# Patient Record
Sex: Male | Born: 1990 | Race: White | Hispanic: No | Smoking: Current every day smoker
Health system: Southern US, Community
[De-identification: ages and names within clinical notes are randomized; demographics above are authoritative.]

## PROBLEM LIST (undated history)

## (undated) DIAGNOSIS — M545 Low back pain, unspecified: Secondary | ICD-10-CM

## (undated) DIAGNOSIS — F419 Anxiety disorder, unspecified: Secondary | ICD-10-CM

## (undated) HISTORY — PX: OTHER SURGICAL HISTORY: SHX169

---
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
## 2009-03-29 ENCOUNTER — Emergency Department (HOSPITAL_COMMUNITY): Admission: EM | Admit: 2009-03-29 | Discharge: 2009-03-29 | Payer: Self-pay | Admitting: Emergency Medicine

## 2010-01-27 ENCOUNTER — Emergency Department (HOSPITAL_COMMUNITY): Admission: EM | Admit: 2010-01-27 | Discharge: 2010-01-27 | Payer: Self-pay | Admitting: Emergency Medicine

## 2010-07-23 NOTE — Op Note (Signed)
Geoffrey [name], Geoffrey [name]             ACCOUNT NO.:  [id number]   MEDICAL RECORD NO.:  [id number]          PATIENT TYPE:  OIB   LOCATION:  NA                           FACILITY:  [facility]   PHYSICIAN:  Duke Salvia, MD, FACCDATE OF BIRTH:  August 19, 1990   DATE OF PROCEDURE:  07/21/2006  DATE OF DISCHARGE:                               OPERATIVE REPORT   PREOPERATIVE DIAGNOSIS:  Wide complex tachycardia.   POSTOPERATIVE DIAGNOSIS:  Wide complex tachycardia.   PROCEDURE:  Treadmill testing.   The patient was submitted to a standard Bruce protocol, supported in  stage 1.   The patient began to walk and began having runs of nonsustained  polymorphic wide complex rhythm that was initially seen to be  ventricular in origin.  The patient's prodrome for his syncope was in  part suggested by the accompanying symptoms.  The test was terminated.   On subsequent review, each of the wide complex flurries is initiated by  a supraventricular beat suggesting either aberration or PAC induced  ventricular rhythm.  I could not find any clear correlation between the  accompanying intervals and the subsequent rhythm.      Duke Salvia, MD, [facility]  Electronically Signed     SCK/MEDQ  D:  07/21/2006  T:  07/21/2006  Job:  [id number]   cc:   Delaney Meigs, M.D.

## 2010-07-23 NOTE — Op Note (Signed)
NAME:  Geoffrey [name], Geoffrey [name] NO.:  [id number]   MEDICAL RECORD NO.:  [id number]          PATIENT TYPE:  INP   LOCATION:  6150                         FACILITY:  [facility]   PHYSICIAN:  Duke Salvia, MD, FACCDATE OF BIRTH:  [birth year]   DATE OF PROCEDURE:  07/21/2006  DATE OF DISCHARGE:                               OPERATIVE REPORT   Mr. Feggins is a 20 year old gentleman with recurrent syncope  associated with seizures.  He has documented wide complex rhythm on an  event record with inducible wide complex with treadmill testing which  was initially thought to be ventricular in origin but may subsequently  be supraventricular in origin.   The patient was submitted for drug testing to try to identify  potentially life-threatening ventricular arrhythmias giving rise to the  patient's symptom complex given the patient's wide complex tachycardia.   Initially the patient was submitted to progressive epinephrine infusion  starting at 0.05 mcg/kg/minute and progressing every 5 minutes over 0.1,  0.2, 0.3 to a peak of 0.4 mcg/kg/minute.  This was then terminated.   The QT interval with the above increased modestly so the QTC maximum was  520 milliseconds.  This may have represented partly a U wave.   The patient was then submitted to isoproterenol infusion at 1 and 2  mcg/minute.  This was based on observations regarding CPVT.  In some  patients, isoproterenol was associated with induction of ventricular  arrhythmias.   The patient was then subjected to an infusion of 1 gram of procainamide.  No evidence of _______ was noted in the anterior precordium.  The  patient's procedure was terminated and the patient was returned to his  room.      Duke Salvia, MD, [facility]  Electronically Signed     SCK/MEDQ  D:  07/21/2006  T:  07/21/2006  Job:  [id number]

## 2010-07-23 NOTE — Assessment & Plan Note (Signed)
[facility]                                 ON-CALL NOTE   NAME:[name], [name]                      MRN:          [id number]  DATE:08/10/2006                            DOB:          10-11-1990    He is a patient of Dr. Viviann Spare Klein's.   Geoffrey [name], the mother of Geoffrey [name], called in this  evening because Geoffrey [name] saw Dr. Graciela Husbands on the 29th and his Toprol dose was  increased from 50 daily to 50 b.i.d.  Geoffrey [name] reported to his  mother this evening that he was feeling a little bit lightheaded when he  stood up and perhaps a little bit fatigued.  He did take his Toprol  b.i.d., as prescribed, yesterday and then also took his Toprol this  morning and they were getting ready to give it to him this evening.  They do not have a way of checking his blood pressure, do not know how  to take a pulse.  I recommended holding the Toprol this evening and re-  evaluating things in the morning.  As he had tolerated 50 daily in the  past, I suggested that if he is feeling well in the morning to go ahead  and give the 50 tomorrow morning and then if he continues to feel well  throughout the day to give 25 mg nightly rather than 50 mg nightly.  Ms.  [name] verbalized understanding and would call us back if they had any  problems tomorrow.     Nicolasa Ducking, ANP  Electronically Signed    CB/MedQ  DD: 08/10/2006  DT: 08/11/2006  Job #: [id number]

## 2010-07-23 NOTE — Assessment & Plan Note (Signed)
[facility]                         ELECTROPHYSIOLOGY OFFICE NOTE   NAME:[name], [name]                    MRN:          [id number]  DATE:08/06/2006                            DOB:          1990-03-21    Geoffrey [name] comes in with his mom.  He was hospitalized because of  recurrent syncope and polymorphic wide complex rhythms.  This was  initially thought to be ventricular in origin, but on closer inspect of  his treadmill, we saw antecedent P waves.  He went to the  electrophysiological laboratory where catheters demonstrated that there  was in fact significant aberration with frequent PAC's.  Each of the  beats were associated with antecedent Hiss bundle deflection consistent  with an atrial arrhythmia.  He was started on Toprol.  He continues to  have some palpitations.   He and his mom are concerned about the possibility that a seizure  disorder still underlies.  I reiterated my hypothesis that these were  syncopal seizures related to a neurally-mediated reflex in the context  of an atrial arrhythmia and evidence of postural orthostatic tachycardia  (i.e. a dysautonomia).  They would like to be sure, so we will plan to  arrange an EEG and an MRI.   PHYSICAL EXAMINATION:  VITAL SIGNS:  On examination today, his blood  pressure was 102/54, pulse was 70.  LUNGS:  Clear.  HEART:  Heart sounds were regular.   IMPRESSION:  1. 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  2. Atrial tachycardia associated with wide complex polymorphic beats      related to aberration.  3. Evidence of postural orthostatic tachycardia syndrome.   PLAN:  Will continue to:  1. Augment salt intake.  2. Further uptitrate his Toprol to 50 b.i.d.  3. Arrange for an MRI.  4. Arrange for an EEG.   FOLLOWUP:  Will plan to see him again in 3 months time.     Duke Salvia, MD, [facility]  Electronically  Signed    SCK/MedQ  DD: 08/06/2006  DT: 08/06/2006  Job #: [id number]   cc:   Delaney Meigs, M.D.
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
## 2010-07-23 NOTE — Assessment & Plan Note (Signed)
[facility]                          [place] [facility] OFFICE NOTE   NAME:[name], [name]                    MRN:          [id number]  DATE:07/17/2006                            DOB:          May 18, 1990    PRIMARY CARDIOLOGIST:  Dr. Lewayne [name] (new).   PRIMARY ELECTROPHYSIOLOGIST:  Dr. Sherryl [name].   REASON FOR CONSULTATION:  Geoffrey [name] is a 20 year old male, with no  prior cardiac history, who was recently referred to Dr. Sherryl [name] for  evaluation of documented ventricular tachycardia in the setting of a  recent possible syncopal episode. Dr. Graciela [name] felt that his was most  likely related to postural tachycardia and noted that patient presented  with a history of orthostatic intolerance. He also noted episodes of  polymorphic VT (3/6 beats) which all appeared to be initiated by a late-  coupled PVC. There was no clear evidence of QT prolongation, however.   Dr. Graciela [name] recommended further evaluation with a cardiac MRI and this was  negative for RV dysplasia. The patient was scheduled for an  electrophysiology test 2 days ago but the patient declined. He tells me  today that he felt that this was not necessary given that he has felt  much better since increasing dietary sodium and drinking more fluids, as  well as completing cutting out caffeine from his diet.   The patient states that he his symptoms of feeling mildly light-headed  upon standing are much better and he has not had any syncopal episodes.  Regarding the latter, his mother points out to me today that this, in  fact, was only one episode. She states that he does not have a history  of syncope and that, in fact, the episode in question may well have been  a seizure. She states that the people at the scene describe seizure-like  activity. Nevertheless, he has not had any recurrent episodes.   ALLERGIES:  CODEINE.   CURRENT MEDICATIONS:  None.   PAST MEDICAL/SOCIAL/FAMILY  HISTORY:  Please refer to Dr. Clayborne [name]  recent consultation note, for full details.   REVIEW OF SYSTEMS:  The patient denies any prior history of syncope or  any history of seizure. Otherwise as per HPI, remaining systems  negative.   PHYSICAL EXAMINATION:  VITAL SIGNS:  Blood pressure 128/73, pulse 84,  regular, weight 161.  GENERAL:  A 20 year old male, thin, in no distress.  HEENT:  Normocephalic, atraumatic.  NECK:  Palpable bilateral carotid pulses without bruits;  no JVD.  LUNGS:  Clear to auscultation in all fields.  HEART:  Regular rate and rhythm (S1, S2), no significant murmurs.  ABDOMEN:  Benign.  EXTREMITIES:  Palpable distal pulses without edema.  NEUROLOGIC:  No focal deficit.   A 2-D echocardiogram from March 18, read by Dr. Andee [name], reveals normal  left ventricular function (EF 55-60%) with no significant valvular  lesions. There was a restrictive membranous VSD with a velocity of 5  meters per second. A repeat 2-D echo in 1 year was recommended.   IMPRESSION:  1. Status post syncopal episode.      a.  Most likely secondary to postural tachycardia.  2. Orthostatic intolerance.      a.     Much improved with increased dietary sodium intake.  3. Polymorphic ventricular tachycardia.      a.     No evidence of right ventricular dysplasia by recent cardiac       MRI.  4. Restrictive membranous VSD.  5. Tobacco.   PLAN:  Following extensive discussion by Dr. Andee [name] with the patient and  his mother, the patient has agreed to proceed with an electrophysiology  study, as recommended by Dr. Sherryl [name]. We will therefore reschedule  this procedure for him and defer further recommendations to Dr. Sherryl [name].      Geoffrey Serpe, PA-C       Geoffrey Codding, MD,FACC    GS/MedQ  DD: 07/17/2006  DT: 07/17/2006  Job #: [id number]
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
## 2010-07-26 NOTE — Discharge Summary (Signed)
Geoffrey [name], [name] NO.:  [id number]   MEDICAL RECORD NO.:  [id number]          PATIENT TYPE:  INP   LOCATION:  3709                         FACILITY:  [facility]   PHYSICIAN:  Doylene Canning. Ladona Ridgel, MD    DATE OF BIRTH:  Jan 01, 1991   DATE OF ADMISSION:  07/20/2006  DATE OF DISCHARGE:  07/24/2006                               DISCHARGE SUMMARY   He has allergies DTP, Phenergan and CODEINE which gives nausea.   FINAL DIAGNOSIS:  Syncope with convulsive activity, probably neurally  mediated.   SECONDARY DIAGNOSES:  1. Discharging day one status post electrophysiology study:  No      inducible ventricular tachycardia.  Finding of nonsustained      supraventricular tachycardia, atrio-ventricular node reentry      tachycardia versus atrio-ventricular node tachycardia with variable      degrees of aberration (no mapping done).  2. No long QT syndrome.  3. Exercise treadmill study May 13th shows recurrent runs of wide      complex tachycardia which reproduced his symptoms.  4. Drug protocol was initiated on May 13th in the electrophysiology      lab to scout for primary electrical disease:  1) Epinephrine; 2)      Isuprel; 3) Procainamide.  This was a negative study in all cases      but analysis of his wide complex tachycardia shows a deflection in      the T-wave which indicates probable supraventricular origin.      Positive findings on the electrophysiology study done May 15th.      Nonsustained supraventricular tachycardia was observed either atrio-      ventricular node reentry tachycardia or atrio-ventricular reentry      tachycardia.  5. The patient has history of multiple broken bones.   BRIEF HISTORY:  Geoffrey [name] is a 20 year old male with known documented  wide complex tachycardia on a 21-day Holter monitor.  It was placed  after a syncopal episode about 1.5 months ago.  He is scheduled for  electrophysiology study on May 14th but presents early to the  emergency  room on May 13th, brought in by emergency medical services after a  syncopal episode.   He was standing in the kitchen, felt a flutter in his chest and felt his  stomach drop like a roller-coaster.  He sat down on the floor and the  next thing he remembers was waking to hear his mother say call 9-1-1.   The mother says that she had left the room for approximately 5 minutes  and returned to find him on his back, arched, arms drawn up, face and  eyes very red with profuse sweating and agonal breathing.  His eyes were  open and nystagmus was observed.  He was unresponsive and incontinent;  however, within 2-3 minutes, he slowly began to stop twitching and  regained color.  However, had the feeling that someone was pressing  against his chest and was short of breath.  He was weak but coherent,  was quickly able to ambulate, change clothes prior to getting into the  ambulance.  He had the same situation about 1-1/2 months' ago and had partial workup  by Dr. Graciela Husbands and Dr. Andee Lineman for polymorphic ventricular tachycardia.  The patient will be admitted and submitted for electrophysiology consult  and study.   HOSPITAL COURSE:  The patient was admitted on May 13th with a syncopal  episode.  He was due to have a hospital admission for various tests  related to a prior syncopal episode about 1-1/2 months' ago and so this  was a timely admission.  On May 13th, he had exercise treadmill study  which showed recurrent runs of wide complex tachycardia and he was able  to reproduce symptoms when in this wide complex tachycardia.  He also  then underwent drug protocol and electrophysiology labs to check for a  primary electrical disease.  Administration of epinephrine, Isuprel and  Procainamide failed to show any indication of this but an analysis of  the wide complex tachycardia showed a deflection of the T-wave which  pointed to a possible supraventricular origin to his tachyarrhythmia.   He underwent electrophysiology study on May 15th.  The study showed  nonsustained supraventricular tachycardia with aberrant conduction  either AVNRT versus AVRT.  No mapping was done.  The patient is  discharging post study day #1 on May 16th with Toprol XL 50 mg and to  follow-up with Dr. Graciela Husbands in the office on Thursday the 29th at 12:30.  He is asked to control or eliminate caffeine and to boost his sodium  intake.      Maple Mirza, PA      Doylene Canning. Ladona Ridgel, MD  Electronically Signed    GM/MEDQ  D:  09/10/2006  T:  09/11/2006  Job:  [id number]   cc:   Duke Salvia, MD, [facility]  Marolyn Hammock. Thad Ranger, M.D.  Delaney Meigs, M.D.

## 2010-12-02 ENCOUNTER — Emergency Department (HOSPITAL_COMMUNITY)
Admission: EM | Admit: 2010-12-02 | Discharge: 2010-12-02 | Discharge status: Against Medical Advice | Payer: Medicaid Other | Attending: Emergency Medicine | Admitting: Emergency Medicine

## 2010-12-02 ENCOUNTER — Encounter: Payer: Self-pay | Admitting: *Deleted

## 2010-12-02 DIAGNOSIS — Z5320 Procedure and treatment not carried out because of patient's decision for unspecified reasons: Secondary | ICD-10-CM | POA: Insufficient documentation

## 2010-12-02 DIAGNOSIS — R042 Hemoptysis: Secondary | ICD-10-CM | POA: Insufficient documentation

## 2010-12-02 NOTE — ED Notes (Signed)
Pt c/o vomiting x 2 this am. Pt also c/o coughing up dark red blood x 2 days. States that it is now pink.

## 2010-12-04 LAB — URINALYSIS, ROUTINE W REFLEX MICROSCOPIC
Bilirubin Urine: NEGATIVE
Glucose, UA: NEGATIVE
Hgb urine dipstick: NEGATIVE
Ketones, ur: NEGATIVE
Nitrite: NEGATIVE
Protein, ur: NEGATIVE
Specific Gravity, Urine: <1.005 — ABNORMAL LOW
Urobilinogen, UA: 0.2
pH: 5.5

## 2011-02-18 ENCOUNTER — Ambulatory Visit: Payer: Medicaid Other | Admitting: Physical Medicine & Rehabilitation

## 2011-11-17 ENCOUNTER — Emergency Department (HOSPITAL_COMMUNITY)
Admission: EM | Admit: 2011-11-17 | Discharge: 2011-11-17 | Disposition: A | Discharge status: Home / Self Care | Payer: Medicaid Other | Attending: Emergency Medicine | Admitting: Emergency Medicine

## 2011-11-17 ENCOUNTER — Encounter (HOSPITAL_COMMUNITY): Payer: Self-pay

## 2011-11-17 DIAGNOSIS — F1010 Alcohol abuse, uncomplicated: Secondary | ICD-10-CM | POA: Insufficient documentation

## 2011-11-17 DIAGNOSIS — S71119A Laceration without foreign body, unspecified thigh, initial encounter: Secondary | ICD-10-CM

## 2011-11-17 DIAGNOSIS — F17200 Nicotine dependence, unspecified, uncomplicated: Secondary | ICD-10-CM | POA: Insufficient documentation

## 2011-11-17 DIAGNOSIS — S71109A Unspecified open wound, unspecified thigh, initial encounter: Secondary | ICD-10-CM | POA: Insufficient documentation

## 2011-11-17 DIAGNOSIS — F411 Generalized anxiety disorder: Secondary | ICD-10-CM | POA: Insufficient documentation

## 2011-11-17 DIAGNOSIS — X789XXA Intentional self-harm by unspecified sharp object, initial encounter: Secondary | ICD-10-CM | POA: Insufficient documentation

## 2011-11-17 DIAGNOSIS — S71009A Unspecified open wound, unspecified hip, initial encounter: Secondary | ICD-10-CM | POA: Insufficient documentation

## 2011-11-17 HISTORY — DX: Low back pain: M54.5

## 2011-11-17 HISTORY — DX: Anxiety disorder, unspecified: F41.9

## 2011-11-17 HISTORY — DX: Low back pain, unspecified: M54.50

## 2011-11-17 MED ORDER — OXYCODONE-ACETAMINOPHEN 5-325 MG PO TABS
1.0000 | ORAL_TABLET | Freq: Once | ORAL | Status: DC
  Filled 2011-11-17: qty 1

## 2011-11-17 MED ORDER — TETANUS-DIPHTH-ACELL PERTUSSIS 5-2.5-18.5 LF-MCG/0.5 IM SUSP
0.5000 mL | Freq: Once | INTRAMUSCULAR | Status: AC
  Administered 2011-11-17: 0.5 mL via INTRAMUSCULAR

## 2011-11-17 MED ORDER — BACITRACIN ZINC 500 UNIT/GM EX OINT
TOPICAL_OINTMENT | Freq: Once | CUTANEOUS | Status: AC
  Administered 2011-11-17: 1 via TOPICAL
  Filled 2011-11-17: qty 15

## 2011-11-17 MED ORDER — HYDROCODONE-ACETAMINOPHEN 5-325 MG PO TABS
2.0000 | ORAL_TABLET | Freq: Once | ORAL | Status: AC
  Administered 2011-11-17: 2 via ORAL
  Filled 2011-11-17: qty 2

## 2011-11-17 MED ORDER — OXYCODONE-ACETAMINOPHEN 5-325 MG PO TABS
1.0000 | ORAL_TABLET | Freq: Once | ORAL | Status: AC
  Administered 2011-11-17: 1 via ORAL
  Filled 2011-11-17: qty 1

## 2011-11-17 MED ORDER — LIDOCAINE-EPINEPHRINE-TETRACAINE (LET) SOLUTION
9.0000 mL | Freq: Once | NASAL | Status: AC
  Administered 2011-11-17: 9 mL via TOPICAL
  Filled 2011-11-17: qty 9

## 2011-11-17 MED ORDER — ONDANSETRON 4 MG PO TBDP
8.0000 mg | ORAL_TABLET | Freq: Once | ORAL | Status: AC
  Administered 2011-11-17: 8 mg via ORAL
  Filled 2011-11-17: qty 2

## 2011-11-17 MED FILL — Bacitracin Oint 500 Unit/GM: CUTANEOUS | Qty: 1 | Status: AC

## 2011-11-17 NOTE — ED Provider Notes (Signed)
Medical screening examination/treatment/procedure(s) were conducted as a shared visit with non-physician practitioner(s) and myself.  I personally evaluated the patient during the encounter   Geoffrey [name]. Geoffrey Pontarelli, MD 11/17/11 1610
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
## 2011-11-17 NOTE — ED Notes (Signed)
The patient is AOx4 and comfortable with his discharge instructions.  The patient is arranging for a ride home.
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
## 2011-11-17 NOTE — ED Notes (Signed)
The patient states that he had nine beers this evening.

## 2011-11-17 NOTE — Discharge Instructions (Signed)

## 2011-11-17 NOTE — ED Provider Notes (Signed)
LACERATION REPAIR Performed by: Dorthula Matas Authorized by: Dorthula Matas Consent: Verbal consent obtained. Risks and benefits: risks, benefits and alternatives were discussed Consent given by: patient Patient identity confirmed: provided demographic data Prepped and Draped in normal sterile fashion Wound explored  Laceration Location: right thigh  Laceration Length: 7 cm  No Foreign Bodies seen or palpated  Anesthesia: local infiltration  Local anesthetic: lidocaine 2% with epinephrine  Anesthetic total: 6 ml  Irrigation method: syringe Amount of cleaning: standard  Skin closure: staples  Number of sutures: 8  Technique: staples  Patient tolerance: Patient tolerated the procedure well with no immediate complications.    See Dr. Aubery Lapping note for HPI  Dorthula Matas, [place] 11/17/11 [id number]

## 2014-09-12 ENCOUNTER — Inpatient Hospital Stay (HOSPITAL_COMMUNITY)
Admission: RE | Admit: 2014-09-12 | Discharge: 2014-09-17 | DRG: 897 | Disposition: A | Discharge status: Home / Self Care | Payer: No Typology Code available for payment source | Attending: Psychiatry | Admitting: Psychiatry

## 2014-09-12 ENCOUNTER — Encounter (HOSPITAL_COMMUNITY): Payer: Self-pay | Admitting: *Deleted

## 2014-09-12 DIAGNOSIS — G4700 Insomnia, unspecified: Secondary | ICD-10-CM | POA: Diagnosis present

## 2014-09-12 DIAGNOSIS — F1124 Opioid dependence with opioid-induced mood disorder: Secondary | ICD-10-CM | POA: Diagnosis present

## 2014-09-12 DIAGNOSIS — Z820 Family history of epilepsy and other diseases of the nervous system: Secondary | ICD-10-CM

## 2014-09-12 DIAGNOSIS — Z630 Problems in relationship with spouse or partner: Secondary | ICD-10-CM | POA: Diagnosis not present

## 2014-09-12 DIAGNOSIS — M545 Low back pain: Secondary | ICD-10-CM | POA: Diagnosis present

## 2014-09-12 DIAGNOSIS — R45851 Suicidal ideations: Secondary | ICD-10-CM | POA: Diagnosis present

## 2014-09-12 DIAGNOSIS — F1123 Opioid dependence with withdrawal: Secondary | ICD-10-CM | POA: Diagnosis not present

## 2014-09-12 DIAGNOSIS — F17210 Nicotine dependence, cigarettes, uncomplicated: Secondary | ICD-10-CM | POA: Diagnosis present

## 2014-09-12 DIAGNOSIS — F332 Major depressive disorder, recurrent severe without psychotic features: Secondary | ICD-10-CM | POA: Diagnosis present

## 2014-09-12 DIAGNOSIS — I959 Hypotension, unspecified: Secondary | ICD-10-CM | POA: Diagnosis not present

## 2014-09-12 LAB — URINALYSIS, ROUTINE W REFLEX MICROSCOPIC
Bilirubin Urine: NEGATIVE
Glucose, UA: NEGATIVE mg/dL
Hgb urine dipstick: NEGATIVE
Ketones, ur: NEGATIVE mg/dL
Nitrite: NEGATIVE
PH: 7.5 (ref 5.0–8.0)
Protein, ur: NEGATIVE mg/dL
Specific Gravity, Urine: 1.02 (ref 1.005–1.030)
UROBILINOGEN UA: 1 mg/dL (ref 0.0–1.0)

## 2014-09-12 LAB — URINE MICROSCOPIC-ADD ON

## 2014-09-12 LAB — HEPATIC FUNCTION PANEL
ALT: 12 U/L — ABNORMAL LOW (ref 17–63)
AST: 16 U/L (ref 15–41)
Albumin: 4.5 g/dL (ref 3.5–5.0)
Alkaline Phosphatase: 71 U/L (ref 38–126)
BILIRUBIN TOTAL: 0.4 mg/dL (ref 0.3–1.2)
Bilirubin, Direct: <0.1 mg/dL — ABNORMAL LOW (ref 0.1–0.5)
Total Protein: 7.4 g/dL (ref 6.5–8.1)

## 2014-09-12 LAB — COMPREHENSIVE METABOLIC PANEL
ALBUMIN: 4.2 g/dL (ref 3.5–5.0)
ALK PHOS: 67 U/L (ref 38–126)
ALT: 12 U/L — ABNORMAL LOW (ref 17–63)
AST: 17 U/L (ref 15–41)
Anion gap: 6 (ref 5–15)
BUN: 17 mg/dL (ref 6–20)
CO2: 30 mmol/L (ref 22–32)
Calcium: 9.3 mg/dL (ref 8.9–10.3)
Chloride: 102 mmol/L (ref 101–111)
Creatinine, Ser: 0.94 mg/dL (ref 0.61–1.24)
GFR calc Af Amer: >60 mL/min (ref >60)
GFR calc non Af Amer: >60 mL/min (ref >60)
Glucose, Bld: 95 mg/dL (ref 65–99)
POTASSIUM: 4.4 mmol/L (ref 3.5–5.1)
Sodium: 138 mmol/L (ref 135–145)
TOTAL PROTEIN: 7.1 g/dL (ref 6.5–8.1)
Total Bilirubin: 0.2 mg/dL — ABNORMAL LOW (ref 0.3–1.2)

## 2014-09-12 LAB — CBC
HCT: 40.7 % (ref 39.0–52.0)
Hemoglobin: 14.4 g/dL (ref 13.0–17.0)
MCH: 32.2 pg (ref 26.0–34.0)
MCHC: 35.4 g/dL (ref 30.0–36.0)
MCV: 91.1 fL (ref 78.0–100.0)
PLATELETS: 179 10*3/uL (ref 150–400)
RBC: 4.47 MIL/uL (ref 4.22–5.81)
RDW: 13.1 % (ref 11.5–15.5)
WBC: 9.3 10*3/uL (ref 4.0–10.5)

## 2014-09-12 LAB — RAPID URINE DRUG SCREEN, HOSP PERFORMED
Amphetamines: NOT DETECTED
Barbiturates: NOT DETECTED
Benzodiazepines: NOT DETECTED
Cocaine: NOT DETECTED
OPIATES: NOT DETECTED
TETRAHYDROCANNABINOL: NOT DETECTED

## 2014-09-12 LAB — ETHANOL: Alcohol, Ethyl (B): <5 mg/dL (ref <5)

## 2014-09-12 LAB — TSH: TSH: 0.162 u[IU]/mL — AB (ref 0.350–4.500)

## 2014-09-12 MED ORDER — NICOTINE 21 MG/24HR TD PT24
21.0000 mg | MEDICATED_PATCH | Freq: Every day | TRANSDERMAL | Status: DC
  Administered 2014-09-13 – 2014-09-15 (×3): 21 mg via TRANSDERMAL
  Filled 2014-09-12 (×5): qty 1

## 2014-09-12 MED ORDER — NICOTINE 21 MG/24HR TD PT24
21.0000 mg | MEDICATED_PATCH | Freq: Once | TRANSDERMAL | Status: AC
  Administered 2014-09-12: 21 mg via TRANSDERMAL
  Filled 2014-09-12: qty 1

## 2014-09-12 MED ORDER — ENSURE ENLIVE PO LIQD
237.0000 mL | Freq: Two times a day (BID) | ORAL | Status: DC
  Administered 2014-09-12 – 2014-09-17 (×10): 237 mL via ORAL

## 2014-09-12 MED ORDER — PNEUMOCOCCAL VAC POLYVALENT 25 MCG/0.5ML IJ INJ
0.5000 mL | INJECTION | INTRAMUSCULAR | Status: AC
  Administered 2014-09-13: 0.5 mL via INTRAMUSCULAR

## 2014-09-12 MED ORDER — ALUM & MAG HYDROXIDE-SIMETH 200-200-20 MG/5ML PO SUSP
30.0000 mL | ORAL | Status: DC | PRN
  Administered 2014-09-14 – 2014-09-16 (×5): 30 mL via ORAL
  Filled 2014-09-12 (×5): qty 30

## 2014-09-12 MED ORDER — TRAZODONE HCL 50 MG PO TABS
50.0000 mg | ORAL_TABLET | Freq: Every evening | ORAL | Status: DC | PRN
  Administered 2014-09-12: 50 mg via ORAL
  Filled 2014-09-12: qty 1

## 2014-09-12 MED ORDER — ACETAMINOPHEN 325 MG PO TABS
650.0000 mg | ORAL_TABLET | Freq: Four times a day (QID) | ORAL | Status: DC | PRN
  Administered 2014-09-12 – 2014-09-16 (×4): 650 mg via ORAL
  Filled 2014-09-12 (×4): qty 2

## 2014-09-12 MED ORDER — MAGNESIUM HYDROXIDE 400 MG/5ML PO SUSP
30.0000 mL | Freq: Every day | ORAL | Status: DC | PRN
  Administered 2014-09-16: 30 mL via ORAL
  Filled 2014-09-12: qty 30

## 2014-09-12 MED ORDER — HYDROXYZINE HCL 25 MG PO TABS
25.0000 mg | ORAL_TABLET | Freq: Four times a day (QID) | ORAL | Status: DC | PRN
  Administered 2014-09-12 – 2014-09-14 (×6): 25 mg via ORAL
  Filled 2014-09-12 (×7): qty 1
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
## 2014-09-12 NOTE — Progress Notes (Deleted)
[facility] MD Progress Note  09/12/2014 6:59 PM Geoffrey [name]  MRN:  [id number] Subjective:  Geoffrey [name] states he is working towards getting his life back together. He continues to be mindful of the negative thoughts and is working towards switching them. He is going to get out of his apartment in [place] and move to a trailer in [place] with his wife. 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 States he has experienced some events here that could have elicited anger spells and they have not. He is hopeful that the medication will continue to work. He states he is committed not to use cough medications again.  Principal Problem: <principal problem not specified> Diagnosis:   Patient Active Problem List   Diagnosis Date Noted  . MDD (major depressive disorder), recurrent episode, severe [F33.2] 09/12/2014   Total Time spent with patient: 30 minutes   Past Medical History:  Past Medical History  Diagnosis Date  . Back pain, lumbosacral   . Anxiety     Past Surgical History  Procedure Laterality Date  . Arm surgery     Family History:  Family History  Problem Relation Age of Onset  . Multiple sclerosis Mother    Social History:  History  Alcohol Use  . 1.2 oz/week  . 2 Cans of beer per week     History  Drug Use  . Yes  . Special: Heroin, "Crack" cocaine    History   Social History  . Marital Status: Single    Spouse Name: N/A  . Number of Children: N/A  . Years of Education: N/A   Social History Main Topics  . Smoking status: Current Every Day Smoker -- 1.00 packs/day for 7 years    Types: Cigarettes  . Smokeless tobacco: Never Used  . Alcohol Use: 1.2 oz/week    2 Cans of beer per week  . Drug Use: Yes    Special: Heroin, "Crack" cocaine  . Sexual Activity: Yes   Other Topics Concern  . None   Social History Narrative   Additional History:    Sleep: Fair  Appetite:  Fair   Assessment:   Musculoskeletal: Strength & Muscle Tone: within normal  limits Gait & Station: normal Patient leans: normal   Psychiatric Specialty Exam: Physical Exam  Review of Systems  Constitutional: Negative.   Eyes: Negative.   Respiratory: Negative.   Cardiovascular: Negative.   Gastrointestinal: Negative.   Genitourinary: Negative.   Musculoskeletal: Negative.   Skin: Negative.   Neurological: Positive for headaches.  Endo/Heme/Allergies: Negative.   Psychiatric/Behavioral: Positive for substance abuse. The patient is nervous/anxious.     Blood pressure 131/78, pulse 68, temperature 98 F (36.7 C), temperature source Oral, resp. rate 18, height  (1.905 m), weight 78.472 kg (173 lb).Body mass index is 21.62 kg/(m^2).  General Appearance: Fairly Groomed  Patent attorney::  Fair  Speech:  Clear and Coherent  Volume:  fluctuates  Mood:  Anxious and worried  Affect:  anxious worried  Thought Process:  Coherent and Goal Directed  Orientation:  Full (Time, Place, and Person)  Thought Content:  symptoms events worries concerns  Suicidal Thoughts:  No  Homicidal Thoughts:  No  Memory:  Immediate;   Fair Recent;   Fair Remote;   Fair  Judgement:  Fair  Insight:  Present  Psychomotor Activity:  Restlessness  Concentration:  Fair  Recall:  [employer] of Knowledge:Fair  Language: Fair  Akathisia:  No  Handed:  Right  AIMS (if indicated):     Assets:  Desire for Improvement Housing Social Support Vocational/Educational  ADL's:  Intact  Cognition: WNL  Sleep:        Current Medications: Current Facility-Administered Medications  Medication Dose Route Frequency Provider Last Rate Last Dose  . acetaminophen (TYLENOL) tablet 650 mg  650 mg Oral Q6H PRN Beau FannyJohn C Withrow, FNP   650 mg at 09/12/14 1814  . alum & mag hydroxide-simeth (MAALOX/MYLANTA) 200-200-20 MG/5ML suspension 30 mL  30 mL Oral Q4H PRN Beau FannyJohn C Withrow, FNP      . feeding supplement (ENSURE ENLIVE) (ENSURE ENLIVE) liquid 237 mL  237 mL Oral BID BM Rockey SituFernando A Cobos, MD   237  mL at 09/12/14 1546  . hydrOXYzine (ATARAX/VISTARIL) tablet 25 mg  25 mg Oral Q6H PRN Beau FannyJohn C Withrow, FNP   25 mg at 09/12/14 1814  . magnesium hydroxide (MILK OF MAGNESIA) suspension 30 mL  30 mL Oral Daily PRN Beau FannyJohn C Withrow, FNP      . [START ON 09/13/2014] nicotine (NICODERM CQ - dosed in mg/24 hours) patch 21 mg  21 mg Transdermal Q0600 Sanjuana KavaAgnes I Nwoko, NP      . nicotine (NICODERM CQ - dosed in mg/24 hours) patch 21 mg  21 mg Transdermal Once Craige CottaFernando A Cobos, MD   21 mg at 09/12/14 1814  . [START ON 09/13/2014] pneumococcal 23 valent vaccine (PNU-IMMUNE) injection 0.5 mL  0.5 mL Intramuscular Tomorrow-1000 Fernando A Cobos, MD      . traZODone (DESYREL) tablet 50 mg  50 mg Oral QHS PRN Beau FannyJohn C Withrow, FNP        Lab Results: No results found for this or any previous visit (from the past 48 hour(s)).  Physical Findings: AIMS: Facial and Oral Movements Muscles of Facial Expression: None, normal Lips and Perioral Area: None, normal Jaw: None, normal Tongue: None, normal,Extremity Movements Upper (arms, wrists, hands, fingers): None, normal Lower (legs, knees, ankles, toes): None, normal, Trunk Movements Neck, shoulders, hips: None, normal, Overall Severity Severity of abnormal movements (highest score from questions above): None, normal Incapacitation due to abnormal movements: None, normal Patient's awareness of abnormal movements (rate only patient's report): No Awareness, Dental Status Current problems with teeth and/or dentures?: No Does patient usually wear dentures?: No  CIWA:  CIWA-Ar Total: 6 COWS:  COWS Total Score: 5  Treatment Plan Summary: Daily contact with patient to assess and evaluate symptoms and progress in treatment and Medication management Supportive approach/coping skills Cough medicine abuse; continue to work a relapse prevention plan Mood instability; continue to work with the Depakote Get a Depakote level Anger; work with anger management  techniques CBT/mindfulness  Medical Decision Making:  Review of Psycho-Social Stressors (1), Review of Medication Regimen & Side Effects (2) and Review of New Medication or Change in Dosage (2)     Cherrill Scrima A 09/12/2014, 6:59 PM

## 2014-09-12 NOTE — Progress Notes (Signed)
ADMISSION NOTE: D: Patient is alert and oriented. Pt's mood and affect is depressed, sad, and blunted. Pt denies SI/HI and AVH. [facility] from "mostly opiates, heroin (pt denies IV use), oxycontin, subutex, and cocaine." Pt denies alcohol abuse, reports he drinks "once a week" and denies binge drinking. Pt reports he is prescribed adderall for ADHD, pt states "thats one of the only things I don't really abuse." Pt reports his wife left him in December and has their two children, he reports "I have opportunities to see them but I don't want them to see me high." Pt reports his father died in 1997, suspected suicide by MVA. Pt reports he does have access to firearms at home and has self harmed once in the past when he cut his leg and needed sutures. Pt reports he is temporarily living with his mother whom is supportive of him, pt reports he is hopeful of "getting back on my feet soon." Pt reports hx of "syncope" last episode "7 years ago." Pt reports his current withdrawal symptoms include nausea, anxiety/nervousness, some sweatiness, moderate headache pain 7/10. 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 Pt reports recent weight loss of "about 20lbs" d/t drug use, pt states "I spend all my money on drugs and not food." A: Pt oriented to unit. Pt given nutrition. Pt given hygiene products. Urine specimen cup given to pt, awaiting return with specimen. Alcohol Use disorders exam given to pt, pt asked to return to RN once he completes questionnaire. Active listening by RN. Encouragement/Support provided to pt. NP Nicole KindredAgnes made aware of pt's arrival to unit and withdrawal symptoms. 15 minute checks initiated per protocol for patient safety.  R: Pt cooperative to admission process. Pt remains safe.

## 2014-09-12 NOTE — Tx Team (Signed)
Initial Interdisciplinary Treatment Plan   PATIENT STRESSORS: Marital or family conflict Substance abuse   PATIENT STRENGTHS: Communication skills General fund of knowledge Supportive family/friends   PROBLEM LIST: Problem List/Patient Goals Date to be addressed Date deferred Reason deferred Estimated date of resolution  Safety 09/12/2014     Withdrawal/Detox/Substance abuse 09/12/2014     Depression 09/12/2014     Anxiety 09/12/2014     "To not want any drugs." 09/12/2014     "Comfortable and confident about leaving." 09/12/2014                        DISCHARGE CRITERIA:  Improved stabilization in mood, thinking, and/or behavior Verbal commitment to aftercare and medication compliance Withdrawal symptoms are absent or subacute and managed without 24-hour nursing intervention  PRELIMINARY DISCHARGE PLAN: Attend 12-step recovery group Outpatient therapy Return to previous living arrangement  PATIENT/FAMIILY INVOLVEMENT: This treatment plan has been presented to and reviewed with the patient, Geoffrey [name].  The patient and family have been given the opportunity to ask questions and make suggestions.  Geoffrey [name], Geoffrey [name] 09/12/2014, 3:21 PM

## 2014-09-12 NOTE — Progress Notes (Signed)
Adult Psychoeducational Group Note  Date:  09/12/2014 Time:  11:36 PM  Group Topic/Focus:    Participation Level:    Participation Quality:    Affect:    Cognitive:    Insight:   Engagement in Group:    Modes of Intervention:    Additional Comments:  Patient did not attend group.  Natasha MeadKiara M Esma Kilts 09/12/2014, 11:36 PM

## 2014-09-12 NOTE — Progress Notes (Signed)
Patient ID: Geoffrey BroachRobert M [name], male   DOB: May 04, 1990, 24 y.o.   MRN: [id number] D: client in bed most of this shift up for medications, complains of stomach problems "from withdrawal" Client notes "they are waiting for the lab work before they give me something" A: Writer provided emotional support, reviewed and administered medications ordered. COW was "0" Staff will monitor q7015min for safety. R: Client is safe on the unit, did not attend group. Writer viewed toxicity, negative result, client unaware, told physician will review and speak with him tomorrow.

## 2014-09-12 NOTE — Progress Notes (Signed)
Urine specimen collected per providers orders and placed in specimen refrigerator, awaiting lab pick-up.

## 2014-09-13 ENCOUNTER — Encounter (HOSPITAL_COMMUNITY): Payer: Self-pay | Admitting: Registered Nurse

## 2014-09-13 DIAGNOSIS — R45851 Suicidal ideations: Secondary | ICD-10-CM

## 2014-09-13 DIAGNOSIS — F1123 Opioid dependence with withdrawal: Secondary | ICD-10-CM

## 2014-09-13 DIAGNOSIS — F1124 Opioid dependence with opioid-induced mood disorder: Secondary | ICD-10-CM

## 2014-09-13 DIAGNOSIS — F332 Major depressive disorder, recurrent severe without psychotic features: Secondary | ICD-10-CM

## 2014-09-13 LAB — LIPID PANEL
Cholesterol: 160 mg/dL (ref 0–200)
HDL: 41 mg/dL (ref >40)
LDL Cholesterol: 92 mg/dL (ref 0–99)
Total CHOL/HDL Ratio: 3.9 RATIO
Triglycerides: 136 mg/dL (ref <150)
VLDL: 27 mg/dL (ref 0–40)

## 2014-09-13 MED ORDER — METHOCARBAMOL 500 MG PO TABS
500.0000 mg | ORAL_TABLET | Freq: Three times a day (TID) | ORAL | Status: DC | PRN
  Administered 2014-09-14: 500 mg via ORAL
  Filled 2014-09-13: qty 1

## 2014-09-13 MED ORDER — ONDANSETRON 4 MG PO TBDP: 4.0000 mg | ORAL_TABLET | Freq: Four times a day (QID) | ORAL | Status: DC | PRN

## 2014-09-13 MED ORDER — CLONIDINE HCL 0.1 MG PO TABS
0.1000 mg | ORAL_TABLET | ORAL | Status: DC
  Filled 2014-09-13: qty 1

## 2014-09-13 MED ORDER — DICYCLOMINE HCL 20 MG PO TABS
20.0000 mg | ORAL_TABLET | Freq: Four times a day (QID) | ORAL | Status: DC | PRN
  Administered 2014-09-14: 20 mg via ORAL
  Filled 2014-09-13: qty 1

## 2014-09-13 MED ORDER — NAPROXEN 500 MG PO TABS
500.0000 mg | ORAL_TABLET | Freq: Two times a day (BID) | ORAL | Status: DC | PRN
  Administered 2014-09-13 – 2014-09-14 (×2): 500 mg via ORAL
  Filled 2014-09-13 (×2): qty 1

## 2014-09-13 MED ORDER — CLONIDINE HCL 0.1 MG PO TABS
0.1000 mg | ORAL_TABLET | Freq: Four times a day (QID) | ORAL | Status: DC
  Administered 2014-09-13 – 2014-09-14 (×2): 0.1 mg via ORAL
  Filled 2014-09-13 (×12): qty 1

## 2014-09-13 MED ORDER — TRAZODONE HCL 100 MG PO TABS
100.0000 mg | ORAL_TABLET | Freq: Every evening | ORAL | Status: DC | PRN
  Administered 2014-09-13: 100 mg via ORAL
  Filled 2014-09-13: qty 1

## 2014-09-13 MED ORDER — LOPERAMIDE HCL 2 MG PO CAPS: 2.0000 mg | ORAL_CAPSULE | ORAL | Status: DC | PRN

## 2014-09-13 MED ORDER — CLONIDINE HCL 0.1 MG PO TABS: 0.1000 mg | ORAL_TABLET | Freq: Every day | ORAL | Status: DC

## 2014-09-13 NOTE — [facility] Group Notes (Signed)
[facility] LCSW Group Therapy 09/13/2014  1:15 PM   Type of Therapy: Group Therapy  Participation Level: Did Not Attend. Patient invited to participate but declined.   Traeson Dusza, MSW, LCSWA Clinical Social Worker [facility] [phone]    

## 2014-09-13 NOTE — H&P (Signed)
Psychiatric Admission Assessment Adult  Patient Identification: Geoffrey [name] MRN:  [id number] Date of Evaluation:  09/13/2014 Chief Complaint:  depression substance use disorder Principal Diagnosis: <principal problem not specified> Diagnosis:   Patient Active Problem List   Diagnosis Date Noted  . MDD (major depressive disorder), recurrent episode, severe [F33.2] 09/12/2014   History of Present Illness:: "I was a walk in. I came in so that I could come off of drugs and detox."  Patient states that he uses multiple drugs Oxycodone, OxyContin, Suboxone, Subutex, Heroin, and Cocaine,  "I been doing heavy for the last two years.  Patient states that he and his wife has separated, states that he two boys that he can see any time he wants but only sees 2-3 time a month because of his drug use and his being high and state that his drug use is also one of the reason that his wife left.  Patient states that he recently quit his job sot that he could get clean.  States that he is living with his mother who is supportive.  States that he has brother who is older and a sister younger.  "I don't speak to my brother is worse off than I am on drugs."  Patient states that he has never been in psychiatric hospital but has had outpatient services and treatment for depression with medication with Prozac, Paxil, and Cymbalta "Which all made my depression worse which all lead to me doing dugs they all mad me feel suicidal and I don't want to do no more medicine for depression." At this time patient continues to endorse depression but denies that he is suicidal.  Patient appears depressed with flat affect.  States that he does worry about finding another job and getting and staying off of drugs and putting his life back together.  When talking about not spending time with his children because of drugs patient  Looked tearful but also restricted.      Elements:  Location:  Polysubstance Abuse. Quality:  Worsening  depression. Severity:  Severe. Duration:  Several days. Associated Signs/Symptoms: Depression Symptoms:  depressed mood, hopelessness, suicidal thoughts without plan, panic attacks, (Hypo) Manic Symptoms:  Irritable Mood, Anxiety Symptoms:  Excessive Worry, Panic Symptoms, Psychotic Symptoms:  Denies PTSD Symptoms: Denies Total Time spent with patient: 1 hour  Past Medical History:  Past Medical History  Diagnosis Date  . Back pain, lumbosacral   . Anxiety     Past Surgical History  Procedure Laterality Date  . Arm surgery     Family History:  Family History  Problem Relation Age of Onset  . Multiple sclerosis Mother    Social History:  History  Alcohol Use  . 1.2 oz/week  . 2 Cans of beer per week     History  Drug Use  . Yes  . Special: Heroin, "Crack" cocaine    History   Social History  . Marital Status: Single    Spouse Name: N/A  . Number of Children: N/A  . Years of Education: N/A   Social History Main Topics  . Smoking status: Current Every Day Smoker -- 1.00 packs/day for 7 years    Types: Cigarettes  . Smokeless tobacco: Never Used  . Alcohol Use: 1.2 oz/week    2 Cans of beer per week  . Drug Use: Yes    Special: Heroin, "Crack" cocaine  . Sexual Activity: Yes   Other Topics Concern  . None   Social History Narrative  Additional Social History:    Pain Medications: See note History of alcohol / drug use?: Yes Name of Substance 1: Opiates 1 - Age of First Use: 20 1 - Amount (size/oz): 120-160 mg Oxycontin  1 - Frequency: Daily 1 - Duration: On going 1 - Last Use / Amount: Yesterday   Musculoskeletal: Strength & Muscle Tone: within normal limits Gait & Station: normal Patient leans: N/A  Psychiatric Specialty Exam: Physical Exam  Vitals reviewed. Constitutional: He is oriented to person, place, and time.  Neck: Normal range of motion.  Respiratory: Effort normal.  Musculoskeletal: Normal range of motion.  Neurological:  He is alert and oriented to person, place, and time.    Review of Systems  Constitutional: Positive for chills and diaphoresis.  Gastrointestinal: Positive for nausea, abdominal pain and constipation.  Musculoskeletal: Positive for neck pain.  Neurological: Positive for dizziness, tingling, tremors, seizures and headaches.       Patient states that he thinks about 8 yrs ago he had 2 seizures about 6 months a part where he would fall in the floor "I would bite my lip, pee my self, shake, feel sleep when finished.  It hasn't happened since.  No I won't doing drugs then."    Psychiatric/Behavioral: Positive for depression and substance abuse. Negative for hallucinations. The patient does not have insomnia.        Patient states when he hasn't had sleep.  "I can remember when driving to work one morning look like something jumped in front of me.  That is when I was up for 4 to 5 day."  Sates on a normal basis he has no hallucinations     Blood pressure 131/74, pulse 73, temperature 98 F (36.7 C), temperature source Oral, resp. rate 16, height $RemoveBe'6\' 3"'[id number]$  (1.905 m), weight 78.472 kg (173 lb).Body mass index is 21.62 kg/(m^2).  General Appearance: Casual  Eye Contact::  Fair  Speech:  Clear and Coherent and Normal Rate  Volume:  Decreased  Mood:  Depressed  Affect:  Depressed and Flat  Thought Process:  Linear  Orientation:  Full (Time, Place, and Person)  Thought Content:  Denies hallucinations, delusions, and paranoia  Suicidal Thoughts:  Yes.  without intent/plan  Homicidal Thoughts:  No  Memory:  Immediate;   Good Recent;   Good Remote;   Good  Judgement:  Fair  Insight:  Lacking  Psychomotor Activity:  Decreased  Concentration:  Fair  Recall:  Good  Fund of Knowledge:Good  Language: Good  Akathisia:  No  Handed:  Right  AIMS (if indicated):     Assets:  Communication Skills Desire for Improvement Housing Physical Health Social Support  ADL's:  Intact  Cognition: WNL  Sleep:       Risk to Self: Suicidal Ideation: Yes-Currently Present Suicidal Intent: No-Not Currently/Within Last 6 Months Is patient at risk for suicide?: Yes Suicidal Plan?: Yes-Currently Present Specify Current Suicidal Plan: overdose Access to Means: Yes Specify Access to Suicidal Means: opiates What has been your use of drugs/alcohol within the last 12 months?: Daily opiate use, occasional cocaine use How many times?: 2 (gestures) Triggers for Past Attempts: Other personal contacts, Family contact Intentional Self Injurious Behavior: Cutting Comment - Self Injurious Behavior: pt has large scar on upper right thigh from self harm Risk to Others: Homicidal Ideation: No Thoughts of Harm to Others: No Current Homicidal Intent: No Current Homicidal Plan: No Access to Homicidal Means: No History of harm to others?: No Assessment of Violence: None  Noted Does patient have access to weapons?: No Criminal Charges Pending?: No Does patient have a court date: No Prior Inpatient Therapy: Prior Inpatient Therapy: No Prior Outpatient Therapy: Prior Outpatient Therapy: Yes Prior Therapy Dates: during childhood-age 4 Reason for Treatment: Father commited suicide Does patient have an ACCT team?: No Does patient have Intensive In-House Services?  : No Does patient have [facility] services? : No Does patient have P4[facility] services?: No  Alcohol Screening: 1. How often do you have a drink containing alcohol?: Monthly or less 2. How many drinks containing alcohol do you have on a typical day when you are drinking?: 1 or 2 3. How often do you have six or more drinks on one occasion?: Less than monthly Preliminary Score: 1 4. How often during the last year have you found that you were not able to stop drinking once you had started?: Monthly 5. How often during the last year have you failed to do what was normally expected from you becasue of drinking?: Never 6. How often during the last year have you needed a  first drink in the morning to get yourself going after a heavy drinking session?: Never 7. How often during the last year have you had a feeling of guilt of remorse after drinking?: Never 8. How often during the last year have you been unable to remember what happened the night before because you had been drinking?: Less than monthly 9. Have you or someone else been injured as a result of your drinking?: No 10. Has a relative or friend or a doctor or another health worker been concerned about your drinking or suggested you cut down?: No Alcohol Use Disorder Identification Test Final Score (AUDIT): 5 Brief Intervention: Yes  Allergies:  No Known Allergies Lab Results:  Results for orders placed or performed during the hospital encounter of 09/12/14 (from the past 48 hour(s))  Urine rapid drug screen (hosp performed)not at [facility]     Status: None   Collection Time: 09/12/14  6:07 PM  Result Value Ref Range   Opiates NONE DETECTED NONE DETECTED   Cocaine NONE DETECTED NONE DETECTED   Benzodiazepines NONE DETECTED NONE DETECTED   Amphetamines NONE DETECTED NONE DETECTED   Tetrahydrocannabinol NONE DETECTED NONE DETECTED   Barbiturates NONE DETECTED NONE DETECTED    Comment:        DRUG SCREEN [place].  IF CONFIRMATION IS NEEDED FOR ANY PURPOSE, NOTIFY LAB WITHIN 5 DAYS.        LOWEST DETECTABLE LIMITS FOR URINE DRUG SCREEN Drug Class       Cutoff (ng/mL) Amphetamine      1000 Barbiturate      200 Benzodiazepine   756 Tricyclics       433 Opiates          300 Cocaine          300 THC              50 Performed at [facility]   Urinalysis, Routine w reflex microscopic (not at [facility])     Status: Abnormal   Collection Time: 09/12/14  6:07 PM  Result Value Ref Range   Color, Urine YELLOW YELLOW   APPearance CLOUDY (A) CLEAR   Specific Gravity, Urine 1.020 1.005 - 1.030   pH 7.5 5.0 - 8.0   Glucose, UA NEGATIVE NEGATIVE mg/dL   Hgb urine dipstick  NEGATIVE NEGATIVE   Bilirubin Urine NEGATIVE NEGATIVE   Ketones, ur NEGATIVE NEGATIVE  mg/dL   Protein, ur NEGATIVE NEGATIVE mg/dL   Urobilinogen, UA 1.0 0.0 - 1.0 mg/dL   Nitrite NEGATIVE NEGATIVE   Leukocytes, UA SMALL (A) NEGATIVE    Comment: Performed at [facility]  Urine microscopic-add on     Status: None   Collection Time: 09/12/14  6:07 PM  Result Value Ref Range   WBC, UA 0-2 <3 WBC/hpf   Bacteria, UA RARE RARE   Urine-Other AMORPHOUS URATES/PHOSPHATES     Comment: Performed at [facility]  CBC     Status: None   Collection Time: 09/12/14  7:22 PM  Result Value Ref Range   WBC 9.3 4.0 - 10.5 K/uL   RBC 4.47 4.22 - 5.81 MIL/uL   Hemoglobin 14.4 13.0 - 17.0 g/dL   HCT 40.7 39.0 - 52.0 %   MCV 91.1 78.0 - 100.0 fL   MCH 32.2 26.0 - 34.0 pg   MCHC 35.4 30.0 - 36.0 g/dL   RDW 13.1 11.5 - 15.5 %   Platelets 179 150 - 400 K/uL    Comment: PLATELET COUNT CONFIRMED BY SMEAR REPEATED TO VERIFY SPECIMEN CHECKED FOR CLOTS Performed at [facility]   Comprehensive metabolic panel     Status: Abnormal   Collection Time: 09/12/14  7:22 PM  Result Value Ref Range   Sodium 138 135 - 145 mmol/L   Potassium 4.4 3.5 - 5.1 mmol/L   Chloride 102 101 - 111 mmol/L   CO2 30 22 - 32 mmol/L   Glucose, Bld 95 65 - 99 mg/dL   BUN 17 6 - 20 mg/dL   Creatinine, Ser 0.94 0.61 - 1.24 mg/dL   Calcium 9.3 8.9 - 10.3 mg/dL   Total Protein 7.1 6.5 - 8.1 g/dL   Albumin 4.2 3.5 - 5.0 g/dL   AST 17 15 - 41 U/L   ALT 12 (L) 17 - 63 U/L   Alkaline Phosphatase 67 38 - 126 U/L   Total Bilirubin 0.2 (L) 0.3 - 1.2 mg/dL   GFR calc non Af Amer >60 >60 mL/min   GFR calc Af Amer >60 >60 mL/min    Comment: (NOTE) The eGFR has been calculated using the CKD EPI equation. This calculation has not been validated in all clinical situations. eGFR's persistently <60 mL/min signify possible Chronic Kidney Disease.    Anion gap 6 5 - 15    Comment:  Performed at [facility]  Ethanol     Status: None   Collection Time: 09/12/14  7:22 PM  Result Value Ref Range   Alcohol, Ethyl (B) <5 <5 mg/dL    Comment:        LOWEST DETECTABLE LIMIT FOR SERUM ALCOHOL IS 5 mg/dL FOR MEDICAL PURPOSES ONLY Performed at [facility]   Lipid panel, fasting     Status: None   Collection Time: 09/12/14  7:22 PM  Result Value Ref Range   Cholesterol 160 0 - 200 mg/dL   Triglycerides 136 <150 mg/dL   HDL 41 >40 mg/dL   Total CHOL/HDL Ratio 3.9 RATIO   VLDL 27 0 - 40 mg/dL   LDL Cholesterol 92 0 - 99 mg/dL    Comment:        Total Cholesterol/HDL:CHD Risk Coronary Heart Disease Risk Table                     Men   Women  1/2 Average Risk   3.4  3.3  Average Risk       5.0   4.4  2 X Average Risk   9.6   7.1  3 X Average Risk  23.4   11.0        Use the calculated Patient Ratio above and the CHD Risk Table to determine the patient's CHD Risk.        ATP III CLASSIFICATION (LDL):  <100     mg/dL   Optimal  100-129  mg/dL   Near or Above                    Optimal  130-159  mg/dL   Borderline  160-189  mg/dL   High  >190     mg/dL   Very High Performed at [facility]   Hepatic function panel     Status: Abnormal   Collection Time: 09/12/14  7:22 PM  Result Value Ref Range   Total Protein 7.4 6.5 - 8.1 g/dL   Albumin 4.5 3.5 - 5.0 g/dL   AST 16 15 - 41 U/L   ALT 12 (L) 17 - 63 U/L   Alkaline Phosphatase 71 38 - 126 U/L   Total Bilirubin 0.4 0.3 - 1.2 mg/dL   Bilirubin, Direct <0.1 (L) 0.1 - 0.5 mg/dL   Indirect Bilirubin NOT CALCULATED 0.3 - 0.9 mg/dL    Comment: Performed at [facility]  TSH     Status: Abnormal   Collection Time: 09/12/14  7:22 PM  Result Value Ref Range   TSH 0.162 (L) 0.350 - 4.500 uIU/mL    Comment: Performed at [facility]   Current Medications: Current Facility-Administered Medications  Medication Dose Route Frequency  Provider Last Rate Last Dose  . acetaminophen (TYLENOL) tablet 650 mg  650 mg Oral Q6H PRN Benjamine Mola, FNP   650 mg at 09/12/14 1814  . alum & mag hydroxide-simeth (MAALOX/MYLANTA) 200-200-20 MG/5ML suspension 30 mL  30 mL Oral Q4H PRN Benjamine Mola, FNP      . feeding supplement (ENSURE ENLIVE) (ENSURE ENLIVE) liquid 237 mL  237 mL Oral BID BM Myer Peer Latoyia Tecson, MD   237 mL at 09/13/14 1039  . hydrOXYzine (ATARAX/VISTARIL) tablet 25 mg  25 mg Oral Q6H PRN Benjamine Mola, FNP   25 mg at 09/13/14 0630  . magnesium hydroxide (MILK OF MAGNESIA) suspension 30 mL  30 mL Oral Daily PRN Benjamine Mola, FNP      . nicotine (NICODERM CQ - dosed in mg/24 hours) patch 21 mg  21 mg Transdermal Q0600 Encarnacion Slates, NP   21 mg at 09/13/14 5188  . nicotine (NICODERM CQ - dosed in mg/24 hours) patch 21 mg  21 mg Transdermal Once Jenne Campus, MD   21 mg at 09/12/14 1814  . traZODone (DESYREL) tablet 50 mg  50 mg Oral QHS PRN Benjamine Mola, FNP   50 mg at 09/12/14 2334   PTA Medications: Prescriptions prior to admission  Medication Sig Dispense Refill Last Dose  . DULoxetine (CYMBALTA) 60 MG capsule Take 60 mg by mouth daily.   11/16/2011 at Unknown  . LORazepam (ATIVAN) 2 MG tablet Take 2 mg by mouth every 6 (six) hours as needed. For anxiety   11/16/2011 at Unknown  . varenicline (CHANTIX PAK) 0.5 MG X 11 & 1 MG X 42 tablet Take by mouth 2 (two) times daily. Take one 0.5 mg tablet by mouth once daily  for 3 days, then increase to one 0.5 mg tablet twice daily for 4 days, then increase to one 1 mg tablet twice daily.   11/16/2011 at Unknown    Previous Psychotropic Medications: Yes   Substance Abuse History in the last 12 months:  Yes.      Consequences of Substance Abuse: Withdrawal Symptoms:   Cramps Diaphoresis Headaches Nausea Tremors Vomiting  Results for orders placed or performed during the hospital encounter of 09/12/14 (from the past 72 hour(s))  Urine rapid drug screen (hosp  performed)not at [facility]     Status: None   Collection Time: 09/12/14  6:07 PM  Result Value Ref Range   Opiates NONE DETECTED NONE DETECTED   Cocaine NONE DETECTED NONE DETECTED   Benzodiazepines NONE DETECTED NONE DETECTED   Amphetamines NONE DETECTED NONE DETECTED   Tetrahydrocannabinol NONE DETECTED NONE DETECTED   Barbiturates NONE DETECTED NONE DETECTED    Comment:        DRUG SCREEN FOR MEDICAL PURPOSES ONLY.  IF CONFIRMATION IS NEEDED FOR ANY PURPOSE, NOTIFY LAB WITHIN 5 DAYS.        LOWEST DETECTABLE LIMITS FOR URINE DRUG SCREEN Drug Class       Cutoff (ng/mL) Amphetamine      1000 Barbiturate      200 Benzodiazepine   326 Tricyclics       712 Opiates          300 Cocaine          300 THC              50 Performed at [facility]   Urinalysis, Routine w reflex microscopic (not at [facility])     Status: Abnormal   Collection Time: 09/12/14  6:07 PM  Result Value Ref Range   Color, Urine YELLOW YELLOW   APPearance CLOUDY (A) CLEAR   Specific Gravity, Urine 1.020 1.005 - 1.030   pH 7.5 5.0 - 8.0   Glucose, UA NEGATIVE NEGATIVE mg/dL   Hgb urine dipstick NEGATIVE NEGATIVE   Bilirubin Urine NEGATIVE NEGATIVE   Ketones, ur NEGATIVE NEGATIVE mg/dL   Protein, ur NEGATIVE NEGATIVE mg/dL   Urobilinogen, UA 1.0 0.0 - 1.0 mg/dL   Nitrite NEGATIVE NEGATIVE   Leukocytes, UA SMALL (A) NEGATIVE    Comment: Performed at [facility]  Urine microscopic-add on     Status: None   Collection Time: 09/12/14  6:07 PM  Result Value Ref Range   WBC, UA 0-2 <3 WBC/hpf   Bacteria, UA RARE RARE   Urine-Other AMORPHOUS URATES/PHOSPHATES     Comment: Performed at [facility]  CBC     Status: None   Collection Time: 09/12/14  7:22 PM  Result Value Ref Range   WBC 9.3 4.0 - 10.5 K/uL   RBC 4.47 4.22 - 5.81 MIL/uL   Hemoglobin 14.4 13.0 - 17.0 g/dL   HCT 40.7 39.0 - 52.0 %   MCV 91.1 78.0 - 100.0 fL   MCH 32.2 26.0 - 34.0 pg   MCHC  35.4 30.0 - 36.0 g/dL   RDW 13.1 11.5 - 15.5 %   Platelets 179 150 - 400 K/uL    Comment: PLATELET COUNT CONFIRMED BY SMEAR REPEATED TO VERIFY SPECIMEN CHECKED FOR CLOTS Performed at [facility]   Comprehensive metabolic panel     Status: Abnormal   Collection Time: 09/12/14  7:22 PM  Result Value Ref Range   Sodium 138 135 - 145  mmol/L   Potassium 4.4 3.5 - 5.1 mmol/L   Chloride 102 101 - 111 mmol/L   CO2 30 22 - 32 mmol/L   Glucose, Bld 95 65 - 99 mg/dL   BUN 17 6 - 20 mg/dL   Creatinine, Ser 0.94 0.61 - 1.24 mg/dL   Calcium 9.3 8.9 - 10.3 mg/dL   Total Protein 7.1 6.5 - 8.1 g/dL   Albumin 4.2 3.5 - 5.0 g/dL   AST 17 15 - 41 U/L   ALT 12 (L) 17 - 63 U/L   Alkaline Phosphatase 67 38 - 126 U/L   Total Bilirubin 0.2 (L) 0.3 - 1.2 mg/dL   GFR calc non Af Amer >60 >60 mL/min   GFR calc Af Amer >60 >60 mL/min    Comment: (NOTE) The eGFR has been calculated using the CKD EPI equation. This calculation has not been validated in all clinical situations. eGFR's persistently <60 mL/min signify possible Chronic Kidney Disease.    Anion gap 6 5 - 15    Comment: Performed at [facility]  Ethanol     Status: None   Collection Time: 09/12/14  7:22 PM  Result Value Ref Range   Alcohol, Ethyl (B) <5 <5 mg/dL    Comment:        LOWEST DETECTABLE LIMIT FOR SERUM ALCOHOL IS 5 mg/dL FOR MEDICAL PURPOSES ONLY Performed at [facility]   Lipid panel, fasting     Status: None   Collection Time: 09/12/14  7:22 PM  Result Value Ref Range   Cholesterol 160 0 - 200 mg/dL   Triglycerides 136 <150 mg/dL   HDL 41 >40 mg/dL   Total CHOL/HDL Ratio 3.9 RATIO   VLDL 27 0 - 40 mg/dL   LDL Cholesterol 92 0 - 99 mg/dL    Comment:        Total Cholesterol/HDL:CHD Risk Coronary Heart Disease Risk Table                     Men   Women  1/2 Average Risk   3.4   3.3  Average Risk       5.0   4.4  2 X Average Risk   9.6   7.1  3 X Average Risk   23.4   11.0        Use the calculated Patient Ratio above and the CHD Risk Table to determine the patient's CHD Risk.        ATP III CLASSIFICATION (LDL):  <100     mg/dL   Optimal  100-129  mg/dL   Near or Above                    Optimal  130-159  mg/dL   Borderline  160-189  mg/dL   High  >190     mg/dL   Very High Performed at [facility]   Hepatic function panel     Status: Abnormal   Collection Time: 09/12/14  7:22 PM  Result Value Ref Range   Total Protein 7.4 6.5 - 8.1 g/dL   Albumin 4.5 3.5 - 5.0 g/dL   AST 16 15 - 41 U/L   ALT 12 (L) 17 - 63 U/L   Alkaline Phosphatase 71 38 - 126 U/L   Total Bilirubin 0.4 0.3 - 1.2 mg/dL   Bilirubin, Direct <0.1 (L) 0.1 - 0.5 mg/dL   Indirect Bilirubin NOT CALCULATED 0.3 - 0.9 mg/dL  Comment: Performed at [facility]  TSH     Status: Abnormal   Collection Time: 09/12/14  7:22 PM  Result Value Ref Range   TSH 0.162 (L) 0.350 - 4.500 uIU/mL    Comment: Performed at [facility]    Observation Level/Precautions:  15 minute checks  Laboratory:  CBC Chemistry Profile UDS UA  Psychotherapy:  Individual and group session  Medications:  Medications will be started added/adjusted as appropriate for patient stabilization  Consultations:  Psychiatry  Discharge Concerns:  Safety, stabilization, and risk of access to medication and medication stabilization   Estimated LOS:  5-7 days  Other:     Psychological Evaluations: Yes   Treatment Plan Summary: Daily contact with patient to assess and evaluate symptoms and progress in treatment and Medication management  1. Admit for crisis management and stabilization 2. Medication management to reduce current symptoms to bale line and improve the patient's overall level of functioning:  Started Clonidine Protocol for Opiate withdrawal/detox.   3. Treat health problems as indicated 4. Develop treatment plan to decrease risk of relapse upon  discharge and the need for readmission. 5. Psycho-social education regarding relapse prevention and self care. 6. Health care follow up as needed for medical problems 7. Restart home medications where appropriate.    Medical Decision Making:  Review of Psycho-Social Stressors (1), Review or order clinical lab tests (1), Independent Review of image, tracing or specimen (2) and Review of Medication Regimen & Side Effects (2)  I certify that inpatient services furnished can reasonably be expected to improve the patient's condition.   Rankin, Shuvon, FNP-BC 7/6/20161:59 PM  Case reviewed with NP and patient seen by me Agree with NP Assessment, Note 24 year old man, presented to hospital requesting detoxification. He has been using opiates Daily, heavily to include oxycontin, heroin, suboxone- states he has been " crushing and snorting" these medications /substances. At times has been abusing cocaine although it is not regular. He is facing some significant losses in his life- his wife separated from him in December 2015 and took their two children with her. He recently quit his job in order to work on getting sober and improving his life . He last used drugs 2 days ago. States he does not drink alcohol often or abuse BZDs. States he has a history of ADHD but minimizes prior history of depression. He states he has never attempted suicide and there is no history of psychosis. He does endorse chronic anxiety, social phobia. He states he not been on any antidepressant for a long period of time- years, except for Adderall, which he denies misusing /abusing . He denies any medical illnesses. At this time presents depressed, constricted in affect. Describes some headache, cramps, muscular aches, but no diarrhea, no vomiting.  Dx- Opiate Dependence, Opiate Withdrawal, Opiate Induced Mood Disorder- depressed, ADHD by history. Plan - Clonidine Detox Protocol. / We discussed antidepressant management, but  not interested at this time/ Trazodone PRN for insomnia. / Follow up on abnormal UA - repeat UA, get U culture- of note patient has no symptoms of dysuria, fever or chills/ Follow up on low TSH-will order T3, T4.

## 2014-09-13 NOTE — [facility] Group Notes (Signed)
[facility] LCSW Aftercare Discharge Planning Group Note  09/13/2014  8:45 AM  Participation Quality: Did Not Attend. Patient invited to participate but declined.  Paelyn Smick, MSW, LCSWA Clinical Social Worker [facility] [phone]   

## 2014-09-13 NOTE — Progress Notes (Addendum)
D: Pt presents with flat affect and depressed mR:ood. Pt rates depression 7/10 Anxiety 9/10. Pt reported using meth and oxy on Monday. Pt requesting to be started on a detox protocol. Pt reports s/s of abd cramps and headaches. Pt denies suicidal thoughts. Pt has minimal interaction on the unit and appears withdrawn. Dr. Jama Flavorsobos made aware of pt complaints and request. A: Medications administered as ordered per MD. Verbal support given. Pt encouraged to attend groups. 15 minute checks performed for safety. R: Pt safety maintained.
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
## 2014-09-13 NOTE — BHH Suicide Risk Assessment (Signed)
B[facility] INPATIENT:  Family/Significant Other Suicide Prevention Education  Suicide Prevention Education:  Education Completed; Mother Geoffrey [name] [phone],  (name of family member/significant other) has been identified by the patient as the family member/significant other with whom the patient will be residing, and identified as the person(s) who will aid the patient in the event of a mental health crisis (suicidal ideations/suicide attempt).  With written consent from the patient, the family member/significant other has been provided the following suicide prevention education, prior to the and/or following the discharge of the patient.  The suicide prevention education provided includes the following:  Suicide risk factors  Suicide prevention and interventions  National Suicide Hotline telephone number  [facility] assessment telephone number  [facility] Emergency Assistance 911  [facility] and/or Residential Mobile Crisis Unit telephone number  Request made of family/significant other to:  Remove weapons (e.g., guns, rifles, knives), all items previously/currently identified as safety concern.    Remove drugs/medications (over-the-counter, prescriptions, illicit drugs), all items previously/currently identified as a safety concern.  The family member/significant other verbalizes understanding of the suicide prevention education information provided.  The family member/significant other agrees to remove the items of safety concern listed above.  Geoffrey [name], Geoffrey [name] 09/13/2014, 3:09 PM

## 2014-09-13 NOTE — Progress Notes (Signed)
Recreation Therapy Notes  Date: 07.06.16 Time: 930 am Location: 300 Hall Group Room  Group Topic: Stress Management  Goal Area(s) Addresses:  Patient will verbalize importance of using healthy stress management.  Patient will identify positive emotions associated with healthy stress management.   Intervention: Stress Management  Activity :  Guided Imagery.  LRT introduced and educated patients on the stress management technique of guided imagery.  [name] script was used to deliver the technique to patients.  Patients were asked to follow the script read [name] loud by LRT to engage in practicing the stress management technique.  Education:  Stress Management, Discharge Planning.   Education Outcome: Acknowledges edcuation/In group clarification offered/Needs additional education  Clinical Observations/Feedback: Did not attend group.    Geoffrey [name], LRT/CTRS         Geoffrey [name] 09/13/2014 3:52 PM 

## 2014-09-14 LAB — URINALYSIS W MICROSCOPIC (NOT AT ARMC)
BILIRUBIN URINE: NEGATIVE
Glucose, UA: NEGATIVE mg/dL
HGB URINE DIPSTICK: NEGATIVE
Ketones, ur: NEGATIVE mg/dL
Leukocytes, UA: NEGATIVE
Nitrite: NEGATIVE
Protein, ur: NEGATIVE mg/dL
SPECIFIC GRAVITY, URINE: 1.024 (ref 1.005–1.030)
UROBILINOGEN UA: 1 mg/dL (ref 0.0–1.0)
pH: 6 (ref 5.0–8.0)

## 2014-09-14 LAB — T4, FREE: Free T4: 0.64 ng/dL (ref 0.61–1.12)

## 2014-09-14 LAB — HEMOGLOBIN A1C
Hgb A1c MFr Bld: 5.4 % (ref 4.8–5.6)
MEAN PLASMA GLUCOSE: 108 mg/dL

## 2014-09-14 MED ORDER — HYDROXYZINE HCL 50 MG PO TABS
50.0000 mg | ORAL_TABLET | Freq: Four times a day (QID) | ORAL | Status: DC | PRN
  Administered 2014-09-14 – 2014-09-17 (×8): 50 mg via ORAL
  Filled 2014-09-14 (×2): qty 1
  Filled 2014-09-14: qty 20
  Filled 2014-09-14 (×6): qty 1

## 2014-09-14 MED ORDER — TRAZODONE HCL 150 MG PO TABS
150.0000 mg | ORAL_TABLET | Freq: Every evening | ORAL | Status: DC | PRN
Start: 2014-09-14 — End: 2014-09-15
  Administered 2014-09-14: 150 mg via ORAL
  Filled 2014-09-14: qty 1

## 2014-09-14 NOTE — Progress Notes (Signed)
Clonidine not given at noon time d/t pt being hypotensive. Pt encouraged to increase fluid intake and given fluids. Pt given a pitcher of Gatorade. Pt stated that he have no appetite and wanted to continue to lay in bed. Pt encouraged to go to lunch.

## 2014-09-14 NOTE — Progress Notes (Signed)
D: Pt was in the hall walking when the writer approached him.  When asked about his day pt stated, "they went up on some of my medications. All the drugs are out of my system". As pt was answering the question he began to walk away from the writer. Pt's eye contact was better but was still poor.   A:  Support and encouragement was offered. 15 min checks continued for safety.  R: Pt remains safe.

## 2014-09-14 NOTE — Progress Notes (Signed)
D: Pt didn't attend group this evening stating that he was "tired". Initially pt only muttered when talking to Retail bankerthe writer. Pt was informed by the writer that he wouldn't be getting his clonidine due to his decreased bp. Pt asked the writer about his meds, stating that his trazodone was supposed to be increased, and that he was to get inbuprofen.    A: Writer discussed the meds with the pt. Support and encouragement was offered. 15 min checks continued for safety.  R: Pt remains safe.

## 2014-09-14 NOTE — Clinical Social Work Note (Signed)
[facility] at patient's [facility].  Samuella BruinKristin Mitsy Owen, MSW, [facility] SewaneeCSWA Clinical Social Worker [facility] [phone]

## 2014-09-14 NOTE — Progress Notes (Signed)
[facility] MD Progress Note  09/14/2014 7:33 PM OTHER ATIENZA  MRN:  [id number] Subjective:  "I couldn't stay asleep.  But I do feel a little better." Objective:  Patient was seen today.  He is still with a depressed affect.  However, he is making good eye contact.  He is still not agreeable to anti depressants at the moment.  He spoke about really wanting to get help.  He is looking forward to getting better.  Clonidine protocol adjusted as patient became hypotensive.  Principal Problem: Opioid dependence with opioid-induced mood disorder Diagnosis:   Patient Active Problem List   Diagnosis Date Noted  . Opioid dependence with opioid-induced mood disorder [F11.24]   . MDD (major depressive disorder), recurrent episode, severe [F33.2] 09/12/2014   Total Time spent with patient: 45 minutes   Past Medical History:  Past Medical History  Diagnosis Date  . Back pain, lumbosacral   . Anxiety     Past Surgical History  Procedure Laterality Date  . Arm surgery     Family History:  Family History  Problem Relation Age of Onset  . Multiple sclerosis Mother    Social History:  History  Alcohol Use  . 1.2 oz/week  . 2 Cans of beer per week     History  Drug Use  . Yes  . Special: Heroin, "Crack" cocaine    History   Social History  . Marital Status: Single    Spouse Name: N/A  . Number of Children: N/A  . Years of Education: N/A   Social History Main Topics  . Smoking status: Current Every Day Smoker -- 1.00 packs/day for 7 years    Types: Cigarettes  . Smokeless tobacco: Never Used  . Alcohol Use: 1.2 oz/week    2 Cans of beer per week  . Drug Use: Yes    Special: Heroin, "Crack" cocaine  . Sexual Activity: Yes   Other Topics Concern  . None   Social History Narrative   Additional History:    Sleep: Fair  Appetite:  Fair   Assessment:   Musculoskeletal: Strength & Muscle Tone: within normal limits Gait & Station: normal Patient leans:  N/A   Psychiatric Specialty Exam: Physical Exam  Vitals reviewed. Psychiatric: His mood appears anxious. He exhibits a depressed mood.    Review of Systems  Constitutional: Negative for fever.  Respiratory: Negative for cough.   Cardiovascular: Negative for chest pain.  Gastrointestinal: Negative for heartburn.  Neurological: Negative for headaches.  Psychiatric/Behavioral: Negative for depression.    Blood pressure 129/63, pulse 92, temperature 98.3 F (36.8 C), temperature source Oral, resp. rate 18, height 6\' 3"  (1.905 m), weight 78.472 kg (173 lb), SpO2 100 %.Body mass index is 21.62 kg/(m^2).   General Appearance: Fairly Groomed  Patent attorney:: Good  Speech: Normal Rate  Volume: Normal  Mood: Depressed, but minimizes depression  Affect: Constricted and Depressed  Thought Process: Goal Directed and Linear  Orientation: Full (Time, Place, and Person)  Thought Content: no hallucinations, no delusions, not internally preoccupied   Suicidal Thoughts: No- denies any suicidal or self injurious ideations   Homicidal Thoughts: No  Memory: recent and remote grossly intact   Judgement: Fair  Insight: Fair  Psychomotor Activity: Decreased  Concentration: Good  Recall: Good  Fund of Knowledge:Good  Language: Good  Akathisia: Negative  Handed: Right  AIMS (if indicated):    Assets: Communication Skills Desire for Improvement Resilience  Sleep:    Cognition: WNL  ADL's: Fair  Current Medications: Current Facility-Administered Medications  Medication Dose Route Frequency Provider Last Rate Last Dose  . acetaminophen (TYLENOL) tablet 650 mg  650 mg Oral Q6H PRN Beau Fanny, FNP   650 mg at 09/13/14 1453  . alum & mag hydroxide-simeth (MAALOX/MYLANTA) 200-200-20 MG/5ML suspension 30 mL  30 mL Oral Q4H PRN Beau Fanny, FNP      . dicyclomine (BENTYL) tablet 20 mg  20 mg Oral Q6H PRN Shuvon B Rankin, NP   20 mg at  09/14/14 0803  . feeding supplement (ENSURE ENLIVE) (ENSURE ENLIVE) liquid 237 mL  237 mL Oral BID BM Rockey Situ Cobos, MD   237 mL at 09/14/14 1443  . hydrOXYzine (ATARAX/VISTARIL) tablet 50 mg  50 mg Oral Q6H PRN Adonis Brook, NP   50 mg at 09/14/14 1834  . loperamide (IMODIUM) capsule 2-4 mg  2-4 mg Oral PRN Shuvon B Rankin, NP      . magnesium hydroxide (MILK OF MAGNESIA) suspension 30 mL  30 mL Oral Daily PRN Beau Fanny, FNP      . methocarbamol (ROBAXIN) tablet 500 mg  500 mg Oral Q8H PRN Shuvon B Rankin, NP   500 mg at 09/14/14 0803  . naproxen (NAPROSYN) tablet 500 mg  500 mg Oral BID PRN Shuvon B Rankin, NP   500 mg at 09/14/14 0803  . nicotine (NICODERM CQ - dosed in mg/24 hours) patch 21 mg  21 mg Transdermal Q0600 Sanjuana Kava, NP   21 mg at 09/14/14 1610  . ondansetron (ZOFRAN-ODT) disintegrating tablet 4 mg  4 mg Oral Q6H PRN Shuvon B Rankin, NP      . traZODone (DESYREL) tablet 150 mg  150 mg Oral QHS PRN Adonis Brook, NP        Lab Results:  Results for orders placed or performed during the hospital encounter of 09/12/14 (from the past 48 hour(s))  T4, free     Status: None   Collection Time: 09/14/14  6:30 AM  Result Value Ref Range   Free T4 0.64 0.61 - 1.12 ng/dL    Comment: Performed at [facility]  Urinalysis with microscopic     Status: Abnormal   Collection Time: 09/14/14  7:07 AM  Result Value Ref Range   Color, Urine YELLOW YELLOW   APPearance CLEAR CLEAR   Specific Gravity, Urine 1.024 1.005 - 1.030   pH 6.0 5.0 - 8.0   Glucose, UA NEGATIVE NEGATIVE mg/dL   Hgb urine dipstick NEGATIVE NEGATIVE   Bilirubin Urine NEGATIVE NEGATIVE   Ketones, ur NEGATIVE NEGATIVE mg/dL   Protein, ur NEGATIVE NEGATIVE mg/dL   Urobilinogen, UA 1.0 0.0 - 1.0 mg/dL   Nitrite NEGATIVE NEGATIVE   Leukocytes, UA NEGATIVE NEGATIVE   Crystals CA OXALATE CRYSTALS (A) NEGATIVE    Comment: Performed at [facility]    Physical Findings: AIMS: Facial  and Oral Movements Muscles of Facial Expression: None, normal Lips and Perioral Area: None, normal Jaw: None, normal Tongue: None, normal,Extremity Movements Upper (arms, wrists, hands, fingers): None, normal Lower (legs, knees, ankles, toes): None, normal, Trunk Movements Neck, shoulders, hips: None, normal, Overall Severity Severity of abnormal movements (highest score from questions above): None, normal Incapacitation due to abnormal movements: None, normal Patient's awareness of abnormal movements (rate only patient's report): No Awareness, Dental Status Current problems with teeth and/or dentures?: No Does patient usually wear dentures?: No  CIWA:  CIWA-Ar Total: 6 COWS:  COWS Total Score: 2  Treatment  Plan Summary: Review of chart, vital signs, medications, and notes.  1-Individual and group therapy  2-Medication management for depression and anxiety: Medications reviewed with the patient.  Trazodone increased to 150 mg QHS.  Vistaril increased to 50 mg Q6 H PRN 3-Coping skills for depression, anxiety  4-Continue crisis stabilization and management  5-Address health issues--monitoring vital signs, stable  6-Treatment plan in progress to prevent relapse of depression and anxiety  Medical Decision Making:  Review of Psycho-Social Stressors (1), Discuss test with performing physician (1), Review and summation of old records (2), Review of Medication Regimen & Side Effects (2) and Review of New Medication or Change in Dosage (2)  Velna HatchetSheila May Agustin AGNP-BC 09/14/2014, 7:33 PM I agree with assessment and plan Madie RenoIrving A. Dub MikesLugo, M.D.

## 2014-09-14 NOTE — Progress Notes (Signed)
NUTRITION ASSESSMENT  Pt identified as at risk on the Malnutrition Screen Tool  INTERVENTION: 1. Educated patient on the importance of nutrition and encouraged intake of food and beverages. 2. Discussed weight goals. 3. Supplements: continue Ensure Enlive BID, each supplement provides 350 kcal and 20 grams of protein  NUTRITION DIAGNOSIS: Unintentional weight loss related to sub-optimal intake as evidenced by pt report.   Goal: Pt to meet >/= 90% of their estimated nutrition needs.  Monitor:  PO intake  Assessment:  Pt seen for MST. Pt unable to be located for discussion. Per chart review, pt stated that he lost 20 lbs PTA due to drug use. He indicated that he was spending all of his money on drugs and not buying food.  Ensure Enlive ordered BID.  24 y.o. male  Height: Ht Readings from Last 1 Encounters:  09/12/14 6\' 3"  (1.905 m)    Weight: Wt Readings from Last 1 Encounters:  09/12/14 173 lb (78.472 kg)    Weight Hx: Wt Readings from Last 10 Encounters:  09/12/14 173 lb (78.472 kg)  11/17/11 175 lb (79.379 kg)  12/02/10 171 lb 2 oz (77.622 kg)    BMI:  Body mass index is 21.62 kg/(m^2). Pt meets criteria for normal weight status based on current BMI.  Estimated Nutritional Needs: Kcal: 25-30 kcal/kg Protein: > 1 gram protein/kg Fluid: 1 ml/kcal  Diet Order: Diet regular Room service appropriate?: Yes; Fluid consistency:: Thin Pt is also offered choice of unit snacks mid-morning and mid-afternoon.  Pt is eating as desired.   Lab results and medications reviewed.     Geoffrey [name], RD, LDN Inpatient Clinical Dietitian Pager # [phone] After hours/weekend pager # [phone]

## 2014-09-14 NOTE — Progress Notes (Signed)
D: Pt presents irritable on approach. Flat affect and depressed mood. Pt denies suicidal thoughts. Pt denies depression. Pt reports anxiety 8/10. Pt reports withdrawal symptoms of headache, muscle spasms, abd cramps and anxiety. Pt given prn meds for withdrawal symptoms. Pt has minimal interaction on the unit and forwards little information.  A: Medications administered as ordered per MD. Verbal support given. Pt encouraged to attend groups. 15 minute checks performed for safety. R: Pt receptive to treatment.

## 2014-09-14 NOTE — Progress Notes (Signed)
Pt did not attend karaoke group this evening.  

## 2014-09-14 NOTE — [facility] Group Notes (Signed)
[facility] LCSW Group Therapy 09/14/2014 1:15 PM Type of Therapy: Group Therapy Participation Level: Active  Participation Quality: Attentive, Sharing and Supportive  Affect: Depressed and Flat  Cognitive: Alert and Oriented  Insight: Developing/Improving and Engaged  Engagement in Therapy: Developing/Improving and Engaged  Modes of Intervention: Activity, Clarification, Confrontation, Discussion, Education, Exploration, Limit-setting, Orientation, Problem-solving, Rapport Building, Reality Testing, Socialization and Support  Summary of Progress/Problems: Patient was attentive and engaged with speaker from [employer]. Patient was attentive to speaker while they shared their story of dealing with mental health and overcoming it. Patient expressed interest in their programs and services and received information on their agency. Patient processed ways they can relate to the speaker.   Catha Ontko, MSW, LCSWA Clinical Social Worker [facility] [phone]   

## 2014-09-14 NOTE — [facility] Group Notes (Signed)
[facility] Group Notes:  (Nursing/MHT/Case Management/Adjunct)  Date:  09/14/2014  Time: 0915  Type of Therapy:  Psychoeducational Skills  Participation Level:  Did Not Attend  Amparo Donalson Evans 09/14/2014, 1:05 PM 

## 2014-09-15 DIAGNOSIS — F1124 Opioid dependence with opioid-induced mood disorder: Principal | ICD-10-CM

## 2014-09-15 LAB — URINE CULTURE
Culture: NO GROWTH
SPECIAL REQUESTS: NORMAL

## 2014-09-15 LAB — T3, FREE: T3 FREE: 2.3 pg/mL (ref 2.0–4.4)

## 2014-09-15 MED ORDER — TRAZODONE HCL 150 MG PO TABS
150.0000 mg | ORAL_TABLET | Freq: Every day | ORAL | Status: DC
  Administered 2014-09-15 – 2014-09-16 (×2): 150 mg via ORAL
  Filled 2014-09-15 (×2): qty 1
  Filled 2014-09-15: qty 14
  Filled 2014-09-15 (×2): qty 1

## 2014-09-15 MED ORDER — NICOTINE POLACRILEX 2 MG MT GUM
2.0000 mg | CHEWING_GUM | OROMUCOSAL | Status: DC | PRN
  Administered 2014-09-15 – 2014-09-17 (×8): 2 mg via ORAL
  Filled 2014-09-15 (×4): qty 1

## 2014-09-15 NOTE — Tx Team (Signed)
Interdisciplinary Treatment Plan Update (Adult) Date: 09/15/2014   Time Reviewed: 9:30 AM  Progress in Treatment: Attending groups: Minimally Participating in groups: No Taking medication as prescribed: Yes Tolerating medication: Yes Family/Significant other contact made: Yes, CSW has spoken with patient's mother Patient understands diagnosis: Yes Discussing patient identified problems/goals with staff: Yes Medical problems stabilized or resolved: Yes Denies suicidal/homicidal ideation: Yes Issues/concerns per patient self-inventory: Yes Other:  New problem(s) identified: N/A  Discharge Plan or Barriers:  7/8: [facility] Patient will likely discharge home to [facility].  Reason for Continuation of Hospitalization:  Depression Anxiety Medication Stabilization   Comments: N/A  Estimated length of stay: 2-3 days  For review of initial/current patient goals, please see plan of care. Patient is a 24 year old Caucasian Male admitted for SI and polysubstance abuse. [place] and sister. Stressors include unemployment, separation from wife and 2 children 6 months ago, and financial stressors. [facility] but is agreeable to return home to await bed if necessary to [facility]. Patient will benefit from crisis stabilization, medication evaluation, group therapy, and psycho education in addition to case management for discharge planning. Patient and CSW reviewed pt's identified goals and treatment plan. Pt verbalized understanding and agreed to treatment plan.   Attendees: Patient:    Family:    Physician: Dr. Jama Flavorsobos; Dr. Dub MikesLugo 09/15/2014 9:30 AM  Nursing: Mosie Epsteinreka, Sarah, Noelle RN 09/15/2014 9:30 AM  Clinical Social Worker: Belenda CruiseKristin Franko Hilliker,  LCSWA 09/15/2014 9:30 AM  Other: Ronda Fairlyatherine Harrill, LCSW 09/15/2014 9:30 AM  Other: Leisa LenzValerie Enoch,  Vesta [facility] Liaison 09/15/2014 9:30 AM  Other: Onnie BoerJennifer [name], Case Manager 09/15/2014 9:30 AM  Other: Assunta FoundShuvon Rankin, May Mendel RyderAugustin, Laura Davis NP 09/15/2014 9:30 AM  Other:    Other:    Other:      Scribe for Treatment Team:  Samuella BruinKristin Sareen Randon, MSW, [employer] [phone]

## 2014-09-15 NOTE — Progress Notes (Addendum)
Patient ID: Geoffrey [name], male   DOB: February 18, 1991, 24 y.o.   MRN: [id number] [facility] MD Progress Note  09/15/2014 3:20 PM Geoffrey [name]  MRN:  [id number] Subjective:   Patient states he is feeling better, states he is not having any ongoing opiate withdrawal symptoms, and denies cravings . He states he is sleeping better on Trazodone .  Objective:  I have discussed case with treatment team and have met with patient. At this time patient minimizes depression, and states he is  " doing "OK" regarding mood .  However, as discussed with staff, he presents with a constricted, rather flat affect.  He does describe improved sleep, improved appetite, improved energy level. At this time denies any cravings for opiates . As discussed with SW, he  Was accepted to a [facility] early next week , but he stated he was not interested in this. 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  He also states his sleep is improved . States " last night I only woke up once ".  Denies medication side effects. FT3, FT4 WNL.  Urine Culture- no growth   Principal Problem: Opioid dependence with opioid-induced mood disorder Diagnosis:   Patient Active Problem List   Diagnosis Date Noted  . Opioid dependence with opioid-induced mood disorder [F11.24]   . MDD (major depressive disorder), recurrent episode, severe [F33.2] 09/12/2014   Total Time spent with patient: 20 minutes   Past Medical History:  Past Medical History  Diagnosis Date  . Back pain, lumbosacral   . Anxiety     Past Surgical History  Procedure Laterality Date  . Arm surgery     Family History:  Family History  Problem Relation Age of Onset  . Multiple sclerosis Mother    Social History:  History  Alcohol Use  . 1.2 oz/week  . 2 Cans of beer per week     History  Drug Use  . Yes  . Special: Heroin, "Crack" cocaine    History   Social History   . Marital Status: Single    Spouse Name: N/A  . Number of Children: N/A  . Years of Education: N/A   Social History Main Topics  . Smoking status: Current Every Day Smoker -- 1.00 packs/day for 7 years    Types: Cigarettes  . Smokeless tobacco: Never Used  . Alcohol Use: 1.2 oz/week    2 Cans of beer per week  . Drug Use: Yes    Special: Heroin, "Crack" cocaine  . Sexual Activity: Yes   Other Topics Concern  . None   Social History Narrative   Additional History:    Sleep: improved   Appetite:  Improved    Musculoskeletal: Strength & Muscle Tone: within normal limits Gait & Station: normal Patient leans: N/A   Psychiatric Specialty Exam: Physical Exam  Vitals reviewed. Psychiatric: His mood appears anxious. He exhibits a depressed mood.    Review of Systems  Constitutional: Negative for fever.  Respiratory: Negative for cough.   Cardiovascular: Negative for chest pain.  Gastrointestinal: Negative for heartburn.  Neurological: Negative for headaches.  Psychiatric/Behavioral: Negative for depression.  denies diarrhea, denies nausea, describes some bloating, denies myalgias or cramps   Blood pressure 121/77, pulse 76, temperature 97.8 F (36.6 C), temperature source Oral, resp. rate 16, height 6' 3"  (1.905 m), weight 173 lb (78.472 kg), SpO2 100 %.Body mass  index is 21.62 kg/(m^2).   General Appearance: improved grooming   Eye Contact:: Good  Speech: Normal Rate  Volume: Normal  Mood: Continues to minimize depression.   Affect:Affect remains constricted   Thought Process: Goal Directed and Linear  Orientation: Full (Time, Place, and Person)  Thought Content: no hallucinations, no delusions, not internally preoccupied   Suicidal Thoughts: No- denies any suicidal or self injurious ideations   Homicidal Thoughts: No  Memory: recent and remote grossly intact   Judgement: Fair  Insight: Fair  Psychomotor Activity:  Improved    Concentration: Good  Recall: Good  Fund of Knowledge:Good  Language: Good  Akathisia: Negative  Handed: Right  AIMS (if indicated):    Assets: Communication Skills Desire for Improvement Resilience  Sleep:    Cognition: WNL  ADL's: Fair          Current Medications: Current Facility-Administered Medications  Medication Dose Route Frequency Provider Last Rate Last Dose  . acetaminophen (TYLENOL) tablet 650 mg  650 mg Oral Q6H PRN Benjamine Mola, FNP   650 mg at 09/14/14 2012  . alum & mag hydroxide-simeth (MAALOX/MYLANTA) 200-200-20 MG/5ML suspension 30 mL  30 mL Oral Q4H PRN Benjamine Mola, FNP   30 mL at 09/15/14 1451  . dicyclomine (BENTYL) tablet 20 mg  20 mg Oral Q6H PRN Shuvon B Rankin, NP   20 mg at 09/14/14 0803  . feeding supplement (ENSURE ENLIVE) (ENSURE ENLIVE) liquid 237 mL  237 mL Oral BID BM Myer Peer Javiana Anwar, MD   237 mL at 09/15/14 1437  . hydrOXYzine (ATARAX/VISTARIL) tablet 50 mg  50 mg Oral Q6H PRN Kerrie Buffalo, NP   50 mg at 09/15/14 1436  . loperamide (IMODIUM) capsule 2-4 mg  2-4 mg Oral PRN Shuvon B Rankin, NP      . magnesium hydroxide (MILK OF MAGNESIA) suspension 30 mL  30 mL Oral Daily PRN Benjamine Mola, FNP      . methocarbamol (ROBAXIN) tablet 500 mg  500 mg Oral Q8H PRN Shuvon B Rankin, NP   500 mg at 09/14/14 0803  . naproxen (NAPROSYN) tablet 500 mg  500 mg Oral BID PRN Shuvon B Rankin, NP   500 mg at 09/14/14 0803  . nicotine polacrilex (NICORETTE) gum 2 mg  2 mg Oral PRN Jenne Campus, MD      . ondansetron (ZOFRAN-ODT) disintegrating tablet 4 mg  4 mg Oral Q6H PRN Shuvon B Rankin, NP      . traZODone (DESYREL) tablet 150 mg  150 mg Oral QHS PRN Kerrie Buffalo, NP   150 mg at 09/14/14 2206    Lab Results:  Results for orders placed or performed during the hospital encounter of 09/12/14 (from the past 48 hour(s))  T3, free     Status: None   Collection Time: 09/14/14  6:30 AM  Result Value Ref Range   T3, Free 2.3 2.0  - 4.4 pg/mL    Comment: (NOTE) Performed At: [facility] [place], [place] [id number] Lindon Romp MD KG:[id number] Performed at [facility]   T4, free     Status: None   Collection Time: 09/14/14  6:30 AM  Result Value Ref Range   Free T4 0.64 0.61 - 1.12 ng/dL    Comment: Performed at [facility]  Urinalysis with microscopic     Status: Abnormal   Collection Time: 09/14/14  7:07 AM  Result Value Ref Range   Color, Urine YELLOW  YELLOW   APPearance CLEAR CLEAR   Specific Gravity, Urine 1.024 1.005 - 1.030   pH 6.0 5.0 - 8.0   Glucose, UA NEGATIVE NEGATIVE mg/dL   Hgb urine dipstick NEGATIVE NEGATIVE   Bilirubin Urine NEGATIVE NEGATIVE   Ketones, ur NEGATIVE NEGATIVE mg/dL   Protein, ur NEGATIVE NEGATIVE mg/dL   Urobilinogen, UA 1.0 0.0 - 1.0 mg/dL   Nitrite NEGATIVE NEGATIVE   Leukocytes, UA NEGATIVE NEGATIVE   Crystals CA OXALATE CRYSTALS (A) NEGATIVE    Comment: Performed at [facility]  Urine culture     Status: None   Collection Time: 09/14/14  7:07 AM  Result Value Ref Range   Specimen Description      URINE, RANDOM Performed at [facility]      Normal Performed at [facility]    Culture      NO GROWTH 1 DAY Performed at [facility]    Report Status 09/15/2014 FINAL     Physical Findings: AIMS: Facial and Oral Movements Muscles of Facial Expression: None, normal Lips and Perioral Area: None, normal Jaw: None, normal Tongue: None, normal,Extremity Movements Upper (arms, wrists, hands, fingers): None, normal Lower (legs, knees, ankles, toes): None, normal, Trunk Movements Neck, shoulders, hips: None, normal, Overall Severity Severity of abnormal movements (highest score from questions above): None, normal Incapacitation due to abnormal movements: None, normal Patient's awareness of abnormal movements (rate only  patient's report): No Awareness, Dental Status Current problems with teeth and/or dentures?: No Does patient usually wear dentures?: No  CIWA:  CIWA-Ar Total: 6 COWS:  COWS Total Score: 0   Assessment - at this time patient is feeling better, and is minimizing any ongoing opiate WDL symptoms.  As noted, does appear constricted, sad at times, but denies or minimizes any depression and reports improved neuro-vegetative symptoms, such as improved sleep ( insomnia had been an issue prior to admission) , improved appetite. Has been offered inpatient rehab, but states he prefers to return home after discharge . Denies cravings  For narcotics at this time.   Treatment Plan Summary: Consider discharge soon as he continues to improve . Continue  Trazodone  150 mg QHS for insomnia ( will make standing rather than PRN, as patient likes the medication and states he wants to take it regularly , has had no side effects)  Continue Vistaril  to 50 mg Q6 H PRN for anxiety  We discussed adding an antidepressant to his regimen, but he declined .    Medical Decision Making:  Review of Psycho-Social Stressors (1), Discuss test with performing physician (1), Review and summation of old records (2), Review of Medication Regimen & Side Effects (2) and Review of New Medication or Change in Dosage (2)  Claude Swendsen  MD  09/15/2014, 3:20 PM

## 2014-09-15 NOTE — Progress Notes (Signed)
D: Pt denies SI/HI/AV. Pt is pleasant and cooperative. Pt rates depression at a 0, anxiety at a 5, and Helplessness/hopelessness at a 0.  A: Pt was offered support and encouragement. Pt was given scheduled medications. Pt was encourage to attend groups. Q 15 minute checks were done for safety.  R:Pt attends groups and interacts well with peers and staff. Pt taking medication. Pt has no complaints.Pt receptive to treatment and safety maintained on unit.

## 2014-09-15 NOTE — [facility] Group Notes (Signed)
   [facility]  09/15/2014  8:45 AM   Participation Quality: Alert, Appropriate and Oriented  Mood/Affect: Depressed and Flat  Depression Rating: 0  Anxiety Rating: 3  Thoughts of Suicide: Pt denies SI/HI  Will you contract for safety? Yes  Current AVH: Pt denies  Plan for Discharge/Comments: Pt attended discharge planning group and actively participated in group. CSW provided pt with today's workbook. [facility] Likely will [facility] home [facility].   Transportation Means: Pt reports access to transportation  Supports: No supports mentioned at this time  Samuella BruinKristin Sekai Gitlin, MSW, [employer]CS[employer] Clinical Social Worker [facility] [phone]

## 2014-09-15 NOTE — [facility] Group Notes (Signed)
[facility] LCSW Group Therapy 09/15/2014 1:15 PM Type of Therapy: Group Therapy Participation Level: Active  Participation Quality: Attentive, Sharing and Supportive  Affect: Appropriate  Cognitive: Alert and Oriented  Insight: Developing/Improving and Engaged  Engagement in Therapy: Developing/Improving and Engaged  Modes of Intervention: Clarification, Confrontation, Discussion, Education, Exploration, Limit-setting, Orientation, Problem-solving, Rapport Building, Dance movement psychotherapisteality Testing, Socialization and Support  Summary of Progress/Problems: The topic for today was feelings about relapse. Pt discussed what relapse prevention is to them and identified triggers that they are on the path to relapse. Pt processed their feeling towards relapse and was able to relate to peers. Pt discussed coping skills that can be used for relapse prevention. 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 CSW and other group members provided patient with emotional support and encouragement.   Samuella BruinKristin Donesha Wallander, MSW, [employer] Clinical Social Worker [facility] [phone]

## 2014-09-16 NOTE — Progress Notes (Signed)
D: Pt informed the writer that he may be discharged tomorrow. Pt asked if the vistaril will be a long term medication. Stated that he doesn't "feel the social anxiety" as much without it. Pt voiced no other questions or concerns.  A:  Support and encouragement was offered. 15 min checks continued for safety.  R: Pt remains safe.

## 2014-09-16 NOTE — Progress Notes (Signed)
D: Pt denies SI/HI/AV. Pt is pleasant and cooperative. Pt rates depression at a 0, anxiety at a 5, and Helplessness/hopelessness at a 0.  A: Pt was offered support and encouragement. Pt was given scheduled medications. Pt was encourage to attend groups. Q 15 minute checks were done for safety.  R:Pt attends groups and interacts well with peers and staff. Pt taking medication. Pt has no complaints.Pt receptive to treatment and safety maintained on unit.

## 2014-09-16 NOTE — [facility] Group Notes (Signed)
[facility] Group Notes:  (Clinical Social Work)  09/16/2014     1:15-2:15PM  Summary of Progress/Problems:   The main focus of today's process group was to learn how to use a decisional balance exercise to move forward in the Stages of Change, which were described and discussed.  Motivational Interviewing and a worksheet and whiteboard were utilized to help patients explore in depth the perceived benefits and costs of unhealthy coping techniques, as well as the  benefits and costs of replacing that with a healthy coping skills.   The patient expressed himself well throughout group, understood the topic and the exercise.  He was supportive of others and demonstrated insight.  Type of Therapy:  Group Therapy - Process   Participation Level:  Active  Participation Quality:  Attentive, Sharing and Supportive  Affect:  Blunted  Cognitive:  Alert and Appropriate  Insight:  Engaged  Engagement in Therapy:  Engaged  Modes of Intervention:  Education, Motivational Interviewing  Ambrose MantleMareida Grossman-Orr, LCSW 09/16/2014, 4:55 PM

## 2014-09-16 NOTE — Progress Notes (Signed)
D: Pt asked the write about the possibilities of him getting medications to take home. Writer asked the pt if he inquired about his meds when he spoke to the extender today. Pt stated he did and was told that "hopefully his primary provider would write an order continuing the med".  Pt informed the writer that he's glad he wasn't discharged today because "one of his friends needed him to stay so she could have somebody to talk to". Pt stated he "has nobody waiting on him out there". Writer encouraged pt to focus on his needs and not the needs of others.  A:  Support and encouragement was offered. 15 min checks continued for safety.  R: Pt remains safe.

## 2014-09-16 NOTE — Progress Notes (Signed)
[facility] MD Progress Note  09/16/2014 11:42 AM Geoffrey [name]  MRN:  [id number] Subjective:   Patient states he is feeling better. This is the best he can do.  It is getting boring here now.  He states he is not having any ongoing opiate withdrawal symptoms, and denies cravings . He states he is sleeping better on Trazodone.  He was dressed in street clothes looked neat.  He was calm, pleasant, smiling.  Objective:  I have discussed case with treatment team and have met with patient. At this time patient minimizes depression, and states he is  "doing OK" regarding mood .  However, as discussed with staff, he presents with a constricted, rather flat affect.  He does describe improved sleep, improved appetite, improved energy level. At this time denies any cravings for opiates . As discussed with SW, he  Was accepted to a [facility] early next week , but he stated he was not interested in this. 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  He also states his sleep is improved .  Denies medication side effects.  Principal Problem: Opioid dependence with opioid-induced mood disorder Diagnosis:   Patient Active Problem List   Diagnosis Date Noted  . Opioid dependence with opioid-induced mood disorder [F11.24]   . MDD (major depressive disorder), recurrent episode, severe [F33.2] 09/12/2014   Total Time spent with patient: 20 minutes   Past Medical History:  Past Medical History  Diagnosis Date  . Back pain, lumbosacral   . Anxiety     Past Surgical History  Procedure Laterality Date  . Arm surgery     Family History:  Family History  Problem Relation Age of Onset  . Multiple sclerosis Mother    Social History:  History  Alcohol Use  . 1.2 oz/week  . 2 Cans of beer per week     History  Drug Use  . Yes  . Special: Heroin, "Crack" cocaine    History   Social History  . Marital Status:  Single    Spouse Name: N/A  . Number of Children: N/A  . Years of Education: N/A   Social History Main Topics  . Smoking status: Current Every Day Smoker -- 1.00 packs/day for 7 years    Types: Cigarettes  . Smokeless tobacco: Never Used  . Alcohol Use: 1.2 oz/week    2 Cans of beer per week  . Drug Use: Yes    Special: Heroin, "Crack" cocaine  . Sexual Activity: Yes   Other Topics Concern  . None   Social History Narrative   Additional History:    Sleep: improved   Appetite:  Improved    Musculoskeletal: Strength & Muscle Tone: within normal limits Gait & Station: normal Patient leans: N/A   Psychiatric Specialty Exam: Physical Exam  Vitals reviewed. Psychiatric: His mood appears anxious. He exhibits a depressed mood.    Review of Systems  Constitutional: Negative for fever.  Respiratory: Negative for cough.   Cardiovascular: Negative for chest pain.  Gastrointestinal: Negative for heartburn.  Neurological: Negative for headaches.  Psychiatric/Behavioral: Negative for depression.  denies diarrhea, denies nausea, describes some bloating, denies myalgias or cramps   Blood pressure 121/77, pulse 76, temperature 97.8 F (36.6 C), temperature source Oral, resp. rate 16, height 6' 3"  (1.905 m), weight 78.472 kg (173 lb), SpO2 100 %.Body mass index is 21.62 kg/(m^2).  General Appearance: improved grooming   Eye Contact:: Good  Speech: Normal Rate  Volume: Normal  Mood: Continues to minimize depression.   Affect:Affect remains constricted   Thought Process: Goal Directed and Linear  Orientation: Full (Time, Place, and Person)  Thought Content: no hallucinations, no delusions, not internally preoccupied   Suicidal Thoughts: No- denies any suicidal or self injurious ideations   Homicidal Thoughts: No  Memory: recent and remote grossly intact   Judgement: Fair  Insight: Fair  Psychomotor Activity:  Improved   Concentration: Good   Recall: Good  Fund of Knowledge:Good  Language: Good  Akathisia: Negative  Handed: Right  AIMS (if indicated):    Assets: Communication Skills Desire for Improvement Resilience  Sleep:    Cognition: WNL  ADL's: Fair          Current Medications: Current Facility-Administered Medications  Medication Dose Route Frequency Provider Last Rate Last Dose  . acetaminophen (TYLENOL) tablet 650 mg  650 mg Oral Q6H PRN Benjamine Mola, FNP   650 mg at 09/14/14 2012  . alum & mag hydroxide-simeth (MAALOX/MYLANTA) 200-200-20 MG/5ML suspension 30 mL  30 mL Oral Q4H PRN Benjamine Mola, FNP   30 mL at 09/15/14 1451  . dicyclomine (BENTYL) tablet 20 mg  20 mg Oral Q6H PRN Shuvon B Rankin, NP   20 mg at 09/14/14 0803  . feeding supplement (ENSURE ENLIVE) (ENSURE ENLIVE) liquid 237 mL  237 mL Oral BID BM Myer Peer Cobos, MD   237 mL at 09/16/14 1031  . hydrOXYzine (ATARAX/VISTARIL) tablet 50 mg  50 mg Oral Q6H PRN Kerrie Buffalo, NP   50 mg at 09/16/14 0816  . loperamide (IMODIUM) capsule 2-4 mg  2-4 mg Oral PRN Shuvon B Rankin, NP      . magnesium hydroxide (MILK OF MAGNESIA) suspension 30 mL  30 mL Oral Daily PRN Benjamine Mola, FNP      . methocarbamol (ROBAXIN) tablet 500 mg  500 mg Oral Q8H PRN Shuvon B Rankin, NP   500 mg at 09/14/14 0803  . naproxen (NAPROSYN) tablet 500 mg  500 mg Oral BID PRN Shuvon B Rankin, NP   500 mg at 09/14/14 0803  . nicotine polacrilex (NICORETTE) gum 2 mg  2 mg Oral PRN Jenne Campus, MD   2 mg at 09/15/14 2006  . ondansetron (ZOFRAN-ODT) disintegrating tablet 4 mg  4 mg Oral Q6H PRN Shuvon B Rankin, NP      . traZODone (DESYREL) tablet 150 mg  150 mg Oral QHS Jenne Campus, MD   150 mg at 09/15/14 2115    Lab Results:  No results found for this or any previous visit (from the past 48 hour(s)).  Physical Findings: AIMS: Facial and Oral Movements Muscles of Facial Expression: None, normal Lips and Perioral Area: None, normal Jaw: None,  normal Tongue: None, normal,Extremity Movements Upper (arms, wrists, hands, fingers): None, normal Lower (legs, knees, ankles, toes): None, normal, Trunk Movements Neck, shoulders, hips: None, normal, Overall Severity Severity of abnormal movements (highest score from questions above): None, normal Incapacitation due to abnormal movements: None, normal Patient's awareness of abnormal movements (rate only patient's report): No Awareness, Dental Status Current problems with teeth and/or dentures?: No Does patient usually wear dentures?: No  CIWA:  CIWA-Ar Total: 6 COWS:  COWS Total Score: 0   Assessment - at this time patient is maintains that feeling better, and is minimizing any ongoing opiate WDL symptoms.  Smiling today.  Dressed in  his normal clothes.  He smiled and appeared less saddened and constricted.  Has been offered inpatient rehab, but states he prefers to return home after discharge . Denies cravings  For narcotics at this time.   Treatment Plan Summary: Discussed with Dr De Nurse about patient ready for discharge tomorrow Sunday 09/17/14 Continue  Trazodone  150 mg QHS for insomnia ( will make standing rather than PRN, as patient likes the medication and states he wants to take it regularly , has had no side effects)  Continue Vistaril  to 50 mg Q6 H PRN for anxiety  We discussed adding an antidepressant to his regimen, but still deferred.     Medical Decision Making:  Review of Psycho-Social Stressors (1), Discuss test with performing physician (1), Review and summation of old records (2), Review of Medication Regimen & Side Effects (2) and Review of New Medication or Change in Dosage (2)  [place]  AGNP-BC 09/16/2014, 11:42 AM

## 2014-09-16 NOTE — Progress Notes (Signed)
The focus of this group is to help patients review their daily goal of treatment and discuss progress on daily workbooks. Pt attended the evening group session and responded to all discussion prompts from the Writer. Pt shared that today was an okay day on the unit, though he was a little frustrated at not being able to go home. Geoffrey [name] was hopeful he would be able to discharge tomorrow. "I guess I'm glad, in a way, that I stayed an extra day. I have some good conversations with some new friends on the hall." Pt reported having no additional needs from Nursing Staff this evening. Pt's affect was appropriate.

## 2014-09-16 NOTE — Progress Notes (Signed)
[facility] Group Notes:  (Nursing/MHT/Case Management/Adjunct)  Date:  09/16/2014  Time:  12:52 AM  Type of Therapy:  Group Therapy  Participation Level:  Minimal  Participation Quality:  Appropriate  Affect:  Appropriate  Cognitive:  Appropriate  Insight:  Appropriate  Engagement in Group:  Improving  Modes of Intervention:  Socialization and Support  Summary of Progress/Problems: Pt. Attended group and had minimal insight in group discussion.  Sondra ComeWilson, Ilse Billman J 09/16/2014, 12:52 AM

## 2014-09-16 NOTE — [facility] Group Notes (Signed)
[facility] Group Notes:  (Nursing/MHT/Case Management/Adjunct)  Date:  09/16/2014  Time:  0900 Type of Therapy:  Nurse Education  Participation Level:  Active  Participation Quality:  Appropriate  Affect:  Appropriate  Cognitive:  Appropriate  Insight:  Appropriate  Engagement in Group:  Engaged  Modes of Intervention:  Discussion  Summary of Progress/Problems:  Geoffrey [name], Geoffrey [name] 09/16/2014, 11:43 AM

## 2014-09-17 MED ORDER — HYDROXYZINE HCL 50 MG PO TABS: 50.0000 mg | ORAL_TABLET | Freq: Four times a day (QID) | ORAL | Status: DC | PRN

## 2014-09-17 MED ORDER — TRAZODONE HCL 150 MG PO TABS: 150.0000 mg | ORAL_TABLET | Freq: Every day | ORAL | Status: DC

## 2014-09-17 MED ORDER — NICOTINE 21 MG/24HR TD PT24
21.0000 mg | MEDICATED_PATCH | Freq: Every day | TRANSDERMAL | Status: DC
  Administered 2014-09-17: 21 mg via TRANSDERMAL
  Filled 2014-09-17 (×4): qty 1

## 2014-09-17 MED ORDER — NICOTINE POLACRILEX 2 MG MT GUM: 2.0000 mg | CHEWING_GUM | OROMUCOSAL | Status: DC | PRN

## 2014-09-17 NOTE — [facility] Group Notes (Signed)
[facility] Group Notes:  (Nursing/MHT/Case Management/Adjunct)  Date:  09/17/2014  Time:  0915 am  Type of Therapy:  Psychoeducational Skills  Participation Level:  Active  Participation Quality:  Appropriate and Attentive  Affect:  Appropriate  Cognitive:  Appropriate  Insight:  Good and Improving  Engagement in Group:  None  Modes of Intervention:  Support  Summary of Progress/Problems: Patient focused on discharge today.  Cranford MonBeaudry, Hau Sanor Evans 09/17/2014, 9:31 AM

## 2014-09-17 NOTE — Discharge Summary (Signed)
Physician Discharge Summary Note  Patient:  Geoffrey [name] is an 24 y.o., male MRN:  [id number] DOB:  March 27, 1990 Patient phone:  [phone] (home)  Patient address:   [address] [place] [address],  Total Time spent with patient: 30 minutes  Date of Admission:  09/12/2014 Date of Discharge: 09/17/14  Reason for Admission:  Opiate abuse, Depressive symptoms  Principal Problem: Opioid dependence with opioid-induced mood disorder Discharge Diagnoses: Patient Active Problem List   Diagnosis Date Noted  . Opioid dependence with opioid-induced mood disorder [F11.24]   . MDD (major depressive disorder), recurrent episode, severe [F33.2] 09/12/2014    Musculoskeletal: Strength & Muscle Tone: within normal limits Gait & Station: normal Patient leans: N/A  Psychiatric Specialty Exam: Physical Exam  Psychiatric: He has a normal mood and affect. His speech is normal and behavior is normal. Judgment and thought content normal. Cognition and memory are normal.    Review of Systems  Constitutional: Negative.   HENT: Negative.   Eyes: Negative.   Respiratory: Negative.   Cardiovascular: Negative.   Gastrointestinal: Negative.   Genitourinary: Negative.   Musculoskeletal: Negative.   Skin: Negative.   Neurological: Negative.   Endo/Heme/Allergies: Negative.   Psychiatric/Behavioral: Positive for depression (Stabilized ). Negative for suicidal ideas, hallucinations, memory loss and substance abuse. The patient is not nervous/anxious and does not have insomnia.     Blood pressure 116/76, pulse 106, temperature 98.1 F (36.7 C), temperature source Oral, resp. rate 15, height 6\' 3"  (1.905 m), weight 78.472 kg (173 lb), SpO2 100 %.Body mass index is 21.62 kg/(m^2).     Have you used any form of tobacco in the last 30 days? (Cigarettes, Smokeless Tobacco, Cigars, and/or Pipes): Yes  Has this patient used any form of tobacco in the last 30 days? (Cigarettes, Smokeless Tobacco,  Cigars, and/or Pipes) Yes, A prescription for an FDA-approved tobacco cessation medication was offered at discharge and the patient refused  Past Medical History:  Past Medical History  Diagnosis Date  . Back pain, lumbosacral   . Anxiety     Past Surgical History  Procedure Laterality Date  . Arm surgery     Family History:  Family History  Problem Relation Age of Onset  . Multiple sclerosis Mother    Social History:  History  Alcohol Use  . 1.2 oz/week  . 2 Cans of beer per week     History  Drug Use  . Yes  . Special: Heroin, "Crack" cocaine    History   Social History  . Marital Status: Single    Spouse Name: N/A  . Number of Children: N/A  . Years of Education: N/A   Social History Main Topics  . Smoking status: Current Every Day Smoker -- 1.00 packs/day for 7 years    Types: Cigarettes  . Smokeless tobacco: Never Used  . Alcohol Use: 1.2 oz/week    2 Cans of beer per week  . Drug Use: Yes    Special: Heroin, "Crack" cocaine  . Sexual Activity: Yes   Other Topics Concern  . None   Social History Narrative   Risk to Self: Suicidal Ideation: Yes-Currently Present Suicidal Intent: No-Not Currently/Within Last 6 Months Is patient at risk for suicide?: Yes Suicidal Plan?: Yes-Currently Present Specify Current Suicidal Plan: overdose Access to Means: Yes Specify Access to Suicidal Means: opiates What has been your use of drugs/alcohol within the last 12 months?: Daily opiate use, occasional cocaine use How many times?: 2 (gestures) Triggers for  Past Attempts: Other personal contacts, Family contact Intentional Self Injurious Behavior: Cutting Comment - Self Injurious Behavior: pt has large scar on upper right thigh from self harm Risk to Others: Homicidal Ideation: No Thoughts of Harm to Others: No Current Homicidal Intent: No Current Homicidal Plan: No Access to Homicidal Means: No History of harm to others?: No Assessment of Violence: None  Noted Does patient have access to weapons?: No Criminal Charges Pending?: No Does patient have a court date: No Prior Inpatient Therapy: Prior Inpatient Therapy: No Prior Outpatient Therapy: Prior Outpatient Therapy: Yes Prior Therapy Dates: during childhood-age 24 Reason for Treatment: Father commited suicide Does patient have an ACCT team?: No Does patient have Intensive In-House Services?  : No Does patient have [facility] services? : No Does patient have P4[facility] services?: No  Level of Care:  OP  Hospital Course:  Geoffrey [name] is an 24 y.o. male who presents seeking treatment for drug abuse and suicidal ideation. 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 He reports that his drug use worsened when his wife left him in December and since that point he's been using around 120-160 mg of oxycontin daily. If unable to secure the oxycontin he has also been suing suboxone, heroin, other opiates and or cocaine. He also smokes 1 ppd of cigarettes. He reports he takes 20 mg of Adderall three times per day as prescribed by his pcp Dr Evonnie Dawes. Geoffrey [name] has two children, a 3 & 26 yo boy, who are in the custody of his wife. He reports he is allowed to see them, but hasn't been doing so. Geoffrey [name] reports he lost his job and spent his 401K on drugs. 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 He reports that he has been experiencing worsening depression with feelings of irritability, hopelessness, anxiety, tearfulness, anhedonia, fatigue, insomnia, increased vegetative symptoms including decreased grooming and spending more time in bed. In addition, he's been experiencing suicidal ideation with a plan to overdose and reports a few times when he has used knowing that he might end his life. Geoffrey [name] has been using sleep to help cope with his SI and depression. He denies HI or AVH.         Geoffrey [name] was  admitted to the adult 400 unit. He was evaluated and his symptoms were identified. Medication management was discussed and initiated. Patient was started on Trazodone to help with insomnia at a dose of 150 mg at bedtime. He completed the clonidine protocol for opiate detox. He was oriented to the unit and encouraged to participate in unit programming. Medical problems were identified and treated appropriately. Home medication was restarted as needed.        The patient was evaluated each day by a clinical provider to ascertain the patient's response to treatment.  Improvement was noted by the patient's report of decreasing symptoms, improved sleep and appetite, affect, medication tolerance, behavior, and participation in unit programming.  He was asked each day to complete a self inventory noting mood, mental status, pain, new symptoms, anxiety and concerns.         He responded well to medication and being in a therapeutic and supportive environment. Positive and appropriate behavior was noted and the patient was motivated for recovery.  The patient worked closely with the treatment team and case manager to develop a discharge plan with appropriate goals. Coping skills,  problem solving as well as relaxation therapies were also part of the unit programming. 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          By the day of discharge he was in much improved condition than upon admission.  Symptoms were reported as significantly decreased or resolved completely. The patient denied SI/HI and voiced no AVH. He was motivated to continue taking medication with a goal of continued improvement in mental health. Geoffrey [name] was discharged home with a plan to follow up as noted below. The patient was provided with sample medications and prescriptions at time of discharge. He left [facility] in stable condition with all belongings returned  to him.   Consults:  None  Significant Diagnostic Studies:  Chemistry panel, Lipid panel, CBC, UDS negative   Discharge Vitals:   Blood pressure 116/76, pulse 106, temperature 98.1 F (36.7 C), temperature source Oral, resp. rate 15, height  (1.905 m), weight 78.472 kg (173 lb), SpO2 100 %. Body mass index is 21.62 kg/(m^2). Lab Results:   No results found for this or any previous visit (from the past 72 hour(s)).  Physical Findings: AIMS: Facial and Oral Movements Muscles of Facial Expression: None, normal Lips and Perioral Area: None, normal Jaw: None, normal Tongue: None, normal,Extremity Movements Upper (arms, wrists, hands, fingers): None, normal Lower (legs, knees, ankles, toes): None, normal, Trunk Movements Neck, shoulders, hips: None, normal, Overall Severity Severity of abnormal movements (highest score from questions above): None, normal Incapacitation due to abnormal movements: None, normal Patient's awareness of abnormal movements (rate only patient's report): No Awareness, Dental Status Current problems with teeth and/or dentures?: No Does patient usually wear dentures?: No  CIWA:  CIWA-Ar Total: 6 COWS:  COWS Total Score: 1   See Psychiatric Specialty Exam and Suicide Risk Assessment completed by Attending Physician prior to discharge.  Discharge destination:  Home  Is patient on multiple antipsychotic therapies at discharge:  No   Has Patient had three or more failed trials of antipsychotic monotherapy by history:  No  Recommended Plan for Multiple Antipsychotic Therapies: NA     Medication List    STOP taking these medications        DULoxetine 60 MG capsule  Commonly known as:  CYMBALTA     LORazepam 2 MG tablet  Commonly known as:  ATIVAN     varenicline 0.5 MG X 11 & 1 MG X 42 tablet  Commonly known as:  CHANTIX PAK      TAKE these medications      Indication   hydrOXYzine 50 MG tablet  Commonly known as:  ATARAX/VISTARIL  Take 1  tablet (50 mg total) by mouth every 6 (six) hours as needed for anxiety.   Indication:  Anxiety Neurosis     nicotine polacrilex 2 MG gum  Commonly known as:  NICORETTE  Take 1 each (2 mg total) by mouth as needed for smoking cessation.   Indication:  Nicotine Addiction     traZODone 150 MG tablet  Commonly known as:  DESYREL  Take 1 tablet (150 mg total) by mouth at bedtime.   Indication:  Trouble Sleeping       Follow-up Information    Follow up with [facility] On 09/21/2014.   Why:  Hospital discharge appointment for therapy and medication management services on Thursday July 14th at 11am. Please bring your social security card and ID. Call  office if you need to reschedule. Ask about IOP program.   Contact information:   [address] [place], [place] [address] Phone: [phone]      Follow-up recommendations:   Activity: as tolerated Diet: regular  Comments:   Take all your medications as prescribed by your mental healthcare provider.  Report any adverse effects and or reactions from your medicines to your outpatient provider promptly.  Patient is instructed and cautioned to not engage in alcohol and or illegal drug use while on prescription medicines.  In the event of worsening symptoms, patient is instructed to call the crisis hotline, 911 and or go to the nearest ED for appropriate evaluation and treatment of symptoms.  Follow-up with your primary care provider for your other medical issues, concerns and or health care needs.   Total Discharge Time: Greater than 30 minutes  Signed: Fransisca Kaufmann, NP-C 09/18/2014, 8:38 AM  I have examined the patient and agree with the discharge plan and findings. I have also done suicide assessment on this patient.

## 2014-09-17 NOTE — Progress Notes (Signed)
  [facility] :  Will you be returning to the same living situation after discharge:  Yes,  with parents and sibling At discharge, do you have transportation home?: Yes,  mother Do you have the ability to pay for your medications: Yes,  no concerns at time of D/C  Release of information consent forms completed and in the chart;  Patient's signature needed at discharge.  Patient to Follow up at: Follow-up Information    [facility].   Why:  Hospital discharge appointment for therapy and medication management services on Thursday July 14th at 11am. Please bring your social security card and ID. Call office if you need to reschedule. Ask about IOP program.   Contact information:   [address] [place], [place] [address] Phone: [phone]      Patient denies SI/HI: Yes,  denies, see doctor's SRA    Safety Planning and Suicide Prevention discussed: Yes,  with mother and with patient  Have you used any form of tobacco in the last 30 days? (Cigarettes, Smokeless Tobacco, Cigars, and/or Pipes): Yes  Has patient been referred to the Quitline?: Yes, faxed on 09/17/14  Sarina SerGrossman-Orr, Thelia Tanksley Jo 09/17/2014, 3:24 PM

## 2014-09-17 NOTE — Progress Notes (Signed)
Patient ID: Geoffrey BroachRobert M [name], male   DOB: 02/01/1991, 24 y.o.   MRN: [id number] D: Patient discharged home per MD order.  Patient received all personal belongings, prescriptions and medication samples.  Reviewed follow up appointment and patient indicated understanding.  Reviewed medications and administration and patient indicated understanding.  He denies SI/HI/AVH.  Patient left ambulatory with his mother.

## 2014-09-17 NOTE — BHH Suicide Risk Assessment (Signed)
[facility]   Demographic Factors:  Male  Total Time spent with patient: 30 minutes  Musculoskeletal: Strength & Muscle Tone: within normal limits Gait & Station: normal Patient leans: no lean  Psychiatric Specialty Exam: Physical Exam  Constitutional: He is oriented to person, place, and time. He appears well-developed and well-nourished.  Neurological: He is alert and oriented to person, place, and time.  Skin: He is not diaphoretic.    Review of Systems  Constitutional: Negative.   Skin: Negative.   Neurological: Negative for tremors.  Psychiatric/Behavioral: Negative for depression and suicidal ideas.    Blood pressure 116/76, pulse 106, temperature 98.1 F (36.7 C), temperature source Oral, resp. rate 15, height 6\' 3"  (1.905 m), weight 78.472 kg (173 lb), SpO2 100 %.Body mass index is 21.62 kg/(m^2).  General Appearance: Casual  Eye Contact::  Fair  Speech:  Normal Rate409  Volume:  Normal  Mood:  Euthymic  Affect:  Congruent  Thought Process:  Coherent  Orientation:  Full (Time, Place, and Person)  Thought Content:  clear  Suicidal Thoughts:  No  Homicidal Thoughts:  No  Memory:  Immediate;   Fair Recent;   Fair  Judgement:  Fair  Insight:  Shallow  Psychomotor Activity:  Normal  Concentration:  Fair  Recall:  FiservFair  Fund of Knowledge:Fair  Language: Fair  Akathisia:  Negative  Handed:  Right  AIMS (if indicated):     Assets:  Desire for Improvement Resilience  Sleep:  Number of Hours: 4.75  Cognition: WNL  ADL's:  Intact   Have you used any form of tobacco in the last 30 days? (Cigarettes, Smokeless Tobacco, Cigars, and/or Pipes): Yes  Has this patient used any form of tobacco in the last 30 days? (Cigarettes, Smokeless Tobacco, Cigars, and/or Pipes) Yes, A prescription for an FDA-approved tobacco cessation medication was offered at discharge and the patient refused  Mental Status Per Nursing Assessment::   On Admission:   NA  Current Mental Status by Physician: see MSE  Loss Factors: Decrease in vocational status  Historical Factors: Impulsivity  Risk Reduction Factors:   Positive therapeutic relationship and Positive coping skills or problem solving skills  Continued Clinical Symptoms:  Alcohol/Substance Abuse/Dependencies More than one psychiatric diagnosis  Cognitive Features That Contribute To Risk:  None    Suicide Risk:  Minimal: No identifiable suicidal ideation.  Patients presenting with no risk factors but with morbid ruminations; may be classified as minimal risk based on the severity of the depressive symptoms  Principal Problem: Opioid dependence with opioid-induced mood disorder Discharge Diagnoses:  Patient Active Problem List   Diagnosis Date Noted  . Opioid dependence with opioid-induced mood disorder [F11.24]   . MDD (major depressive disorder), recurrent episode, severe [F33.2] 09/12/2014    Follow-up Information    [facility]   Why:  Hospital discharge appointment for therapy and medication management services on Thursday July 14th at 11am. Please bring your social security card and ID. Call office if you need to reschedule. Ask about IOP program.   Contact information:   [address] [place], [place] [address] Phone: [phone]     See Discharge summary. Follow up with recommendations and compliance . Long term sobreity and NA support groups Plan Of Care/Follow-up recommendations:  Activity:  as tolerated Diet:  regular  Is patient on multiple antipsychotic therapies at discharge:  No   Has Patient had three or more failed trials of antipsychotic monotherapy by history:  No  Recommended Plan for Multiple Antipsychotic Therapies: NA    Dariya Gainer 09/17/2014, 9:54 AM

## 2015-04-03 ENCOUNTER — Inpatient Hospital Stay (HOSPITAL_COMMUNITY)
Admission: RE | Admit: 2015-04-03 | Discharge: 2015-04-06 | DRG: 885 | Disposition: A | Discharge status: Home / Self Care | Payer: No Typology Code available for payment source | Attending: Psychiatry | Admitting: Psychiatry

## 2015-04-03 ENCOUNTER — Encounter (HOSPITAL_COMMUNITY): Payer: Self-pay | Admitting: Emergency Medicine

## 2015-04-03 DIAGNOSIS — F332 Major depressive disorder, recurrent severe without psychotic features: Principal | ICD-10-CM | POA: Diagnosis present

## 2015-04-03 DIAGNOSIS — F909 Attention-deficit hyperactivity disorder, unspecified type: Secondary | ICD-10-CM | POA: Diagnosis present

## 2015-04-03 DIAGNOSIS — Z820 Family history of epilepsy and other diseases of the nervous system: Secondary | ICD-10-CM | POA: Diagnosis not present

## 2015-04-03 DIAGNOSIS — F1994 Other psychoactive substance use, unspecified with psychoactive substance-induced mood disorder: Secondary | ICD-10-CM | POA: Diagnosis present

## 2015-04-03 DIAGNOSIS — F1124 Opioid dependence with opioid-induced mood disorder: Secondary | ICD-10-CM | POA: Diagnosis present

## 2015-04-03 DIAGNOSIS — G4700 Insomnia, unspecified: Secondary | ICD-10-CM | POA: Diagnosis present

## 2015-04-03 DIAGNOSIS — F17210 Nicotine dependence, cigarettes, uncomplicated: Secondary | ICD-10-CM | POA: Diagnosis present

## 2015-04-03 DIAGNOSIS — F410 Panic disorder [episodic paroxysmal anxiety] without agoraphobia: Secondary | ICD-10-CM | POA: Diagnosis present

## 2015-04-03 DIAGNOSIS — R45851 Suicidal ideations: Secondary | ICD-10-CM | POA: Diagnosis present

## 2015-04-03 MED ORDER — NICOTINE POLACRILEX 2 MG MT GUM
2.0000 mg | CHEWING_GUM | OROMUCOSAL | Status: DC | PRN
  Administered 2015-04-03: 2 mg via ORAL
  Filled 2015-04-03: qty 1

## 2015-04-03 MED ORDER — DICYCLOMINE HCL 20 MG PO TABS
20.0000 mg | ORAL_TABLET | Freq: Four times a day (QID) | ORAL | Status: DC | PRN
  Administered 2015-04-03 – 2015-04-05 (×4): 20 mg via ORAL
  Filled 2015-04-03 (×4): qty 1

## 2015-04-03 MED ORDER — ONDANSETRON 4 MG PO TBDP
4.0000 mg | ORAL_TABLET | Freq: Four times a day (QID) | ORAL | Status: DC | PRN
  Administered 2015-04-04: 4 mg via ORAL
  Filled 2015-04-03: qty 1

## 2015-04-03 MED ORDER — NAPROXEN 500 MG PO TABS
500.0000 mg | ORAL_TABLET | Freq: Two times a day (BID) | ORAL | Status: DC | PRN
  Administered 2015-04-04: 500 mg via ORAL
  Filled 2015-04-03: qty 1

## 2015-04-03 MED ORDER — CLONIDINE HCL 0.1 MG PO TABS
0.1000 mg | ORAL_TABLET | ORAL | Status: DC
  Filled 2015-04-03 (×4): qty 1

## 2015-04-03 MED ORDER — MAGNESIUM HYDROXIDE 400 MG/5ML PO SUSP: 30.0000 mL | Freq: Every day | ORAL | Status: DC | PRN

## 2015-04-03 MED ORDER — HYDROXYZINE HCL 50 MG PO TABS
50.0000 mg | ORAL_TABLET | Freq: Four times a day (QID) | ORAL | Status: DC | PRN
  Administered 2015-04-04 – 2015-04-05 (×5): 50 mg via ORAL
  Filled 2015-04-03: qty 10
  Filled 2015-04-03 (×5): qty 1

## 2015-04-03 MED ORDER — LOPERAMIDE HCL 2 MG PO CAPS
2.0000 mg | ORAL_CAPSULE | ORAL | Status: DC | PRN
Start: 2015-04-03 — End: 2015-04-06
  Administered 2015-04-05: 2 mg via ORAL
  Filled 2015-04-03: qty 2

## 2015-04-03 MED ORDER — CLONIDINE HCL 0.1 MG PO TABS
0.1000 mg | ORAL_TABLET | Freq: Four times a day (QID) | ORAL | Status: AC
  Administered 2015-04-03 – 2015-04-06 (×8): 0.1 mg via ORAL
  Filled 2015-04-03 (×13): qty 1

## 2015-04-03 MED ORDER — TRAZODONE HCL 150 MG PO TABS
150.0000 mg | ORAL_TABLET | Freq: Every day | ORAL | Status: DC
  Administered 2015-04-03 – 2015-04-05 (×3): 150 mg via ORAL
  Filled 2015-04-03 (×5): qty 1
  Filled 2015-04-03: qty 7
  Filled 2015-04-03 (×2): qty 1

## 2015-04-03 MED ORDER — METHOCARBAMOL 500 MG PO TABS
500.0000 mg | ORAL_TABLET | Freq: Three times a day (TID) | ORAL | Status: DC | PRN
  Administered 2015-04-06: 500 mg via ORAL
  Filled 2015-04-03: qty 1

## 2015-04-03 MED ORDER — ALUM & MAG HYDROXIDE-SIMETH 200-200-20 MG/5ML PO SUSP
30.0000 mL | ORAL | Status: DC | PRN
  Administered 2015-04-05: 30 mL via ORAL
  Filled 2015-04-03: qty 30

## 2015-04-03 MED ORDER — ENSURE ENLIVE PO LIQD
237.0000 mL | Freq: Two times a day (BID) | ORAL | Status: DC
  Administered 2015-04-04 – 2015-04-06 (×5): 237 mL via ORAL

## 2015-04-03 MED ORDER — CLONIDINE HCL 0.1 MG PO TABS: 0.1000 mg | ORAL_TABLET | Freq: Every day | ORAL | Status: DC

## 2015-04-03 MED ORDER — ACETAMINOPHEN 325 MG PO TABS
650.0000 mg | ORAL_TABLET | Freq: Four times a day (QID) | ORAL | Status: DC | PRN
  Administered 2015-04-03 – 2015-04-04 (×2): 650 mg via ORAL
  Filled 2015-04-03 (×2): qty 2

## 2015-04-03 NOTE — Tx Team (Signed)
Initial Interdisciplinary Treatment Plan   PATIENT STRESSORS: Financial difficulties Loss of multiple friends from overdose Medication change or noncompliance Substance abuse   PATIENT STRENGTHS: Average or above average intelligence Communication skills General fund of knowledge Physical Health   PROBLEM LIST: Problem List/Patient Goals Date to be addressed Date deferred Reason deferred Estimated date of resolution  "get medication or something for depression"  04/03/15     "coping skills"  04/03/15     "I'm trying to find something to get all this out of my head"  04/03/15           SI 04/03/15     Substance abuse 04/03/15     depression 04/03/15                  DISCHARGE CRITERIA:  Improved stabilization in mood, thinking, and/or behavior Withdrawal symptoms are absent or subacute and managed without 24-hour nursing intervention  PRELIMINARY DISCHARGE PLAN: Attend aftercare/continuing care group Attend 12-step recovery group  PATIENT/FAMIILY INVOLVEMENT: This treatment plan has been presented to and reviewed with the patient, Geoffrey [name].  The patient and family have been given the opportunity to ask questions and make suggestions.  Arrie Aran 04/03/2015, 9:46 PM

## 2015-04-03 NOTE — BH Assessment (Signed)
Tele Assessment Note   Geoffrey [name] is an 25 y.o. male. Pt presents voluntarily to [facility] for assessment. He is cooperative and oriented x 4. His mood is depressed and anxious, and his affect is mood congruent. He endorses SI. Pt says, "I have suicidal thoughts and they are freaking me out."  He reports he got a new job two days ago and his depressvie sxs are preventing him from doing well at the job. He endorses hopelessness, anhedonia, fatigue, insomnia, tearfulness and worthlessness. He reports severe anxiety and says he had a panic attack last night. Pt reports his dad died from a drug overdose when dad was 57 yo. Pt reports that it has been exactly one year since his wife left him. He has two children who live with estranged wife. Pt denies HI. He denies [facility] and no delusions noted. Pt reports he has been abusing opiates (Oxycontin) and has recently begun using heroin again by snorting it. He says he has also started using cocaine. Pt says last use of heroin and coke was 03/31/15. Pt says, "I want to get the right treatment." Pt says he wasn't completely honest about his depressive sxs last time he was admitted here July 2016 and he wants to be honest this time. He says he knows he needs help and says he is motivated to stay clean and sober. Pt reports he takes Adderall as prescribed but his friends sometime steal his meds. He says the Adderall works for his anxiety, but he says when he runs out of the med he then wants to use opiates. Pt sts he doesn't even like taking opiates, he is doing it now so he won't get sick. Pt denies any upcoming court dates. He says he has a traffic court date sometime in late March.   Diagnosis:   Opioid Use Disorder, Severe Major Depressive Disorder, Recurrent, Moderate  Past Medical History:  Past Medical History  Diagnosis Date  . Back pain, lumbosacral   . Anxiety     Past Surgical History  Procedure Laterality Date  . Arm surgery      Family History:   Family History  Problem Relation Age of Onset  . Multiple sclerosis Mother     Social History:  reports that he has been smoking Cigarettes.  He has a 7 pack-year smoking history. He has never used smokeless tobacco. He reports that he uses illicit drugs (Heroin, "Crack" cocaine, and Hydrocodone). He reports that he does not drink alcohol.  Additional Social History:  Alcohol / Drug Use Pain Medications: pt reports abusing oxycontin Prescriptions: pt denies abuse Over the Counter: pt denies abuse History of alcohol / drug use?: Yes Negative Consequences of Use: Financial, Personal relationships, Work / Mining engineer #1 Name of Substance 1: opiates - oxycontin, Heroin (snorts) 1 - Age of First Use: 20 1 - Amount (size/oz): varies 1 - Frequency: daily 1 - Duration: months 1 - Last Use / Amount: 03/31/15 Substance #2 Name of Substance 2: cocaine 2 - Duration: only recently 2 - Last Use / Amount: 03/31/15  CIWA:   COWS:    PATIENT STRENGTHS: (choose at least two) Ability for insight Average or above average intelligence Communication skills General fund of knowledge  Allergies: No Known Allergies  Home Medications:  (Not in a hospital admission)  OB/GYN Status:  No LMP for male patient.  General Assessment Data Location of Assessment: [facility] Assessment Services TTS Assessment: In system Is this a Tele or Face-to-Face Assessment?: Face-to-Face  Is this an Initial Assessment or a Re-assessment for this encounter?: Initial Assessment Marital status: Separated Living Arrangements: Alone Can pt return to current living arrangement?: Yes Admission Status: Voluntary Is patient capable of signing voluntary admission?: Yes Referral Source: Self/Family/Friend Insurance type: self pay     Crisis Care Plan Living Arrangements: Alone Name of Psychiatrist: none Name of Therapist: none  Education Status Is patient currently in school?: No Highest grade of school patient has  completed: 12  Risk to self with the past 6 months Suicidal Ideation: Yes-Currently Present Has patient been a risk to self within the past 6 months prior to admission? : Yes Suicidal Intent: No Has patient had any suicidal intent within the past 6 months prior to admission? : No Is patient at risk for suicide?: Yes Suicidal Plan?: No Has patient had any suicidal plan within the past 6 months prior to admission? : No Access to Means:  (n/a) What has been your use of drugs/alcohol within the last 12 months?: daily opiate use, recent cocaine use Previous Attempts/Gestures: No How many times?: 0 Other Self Harm Risks: none Triggers for Past Attempts:  (n/a) Intentional Self Injurious Behavior: None (pt sts hasn't cut himself in two years) Family Suicide History: No (dad died from drug OD at 63 yo) Recent stressful life event(s): Other (Comment), Recent negative physical changes (depressive symptoms, drug abuse) Persecutory voices/beliefs?: No Depression: Yes Depression Symptoms: Fatigue, Insomnia, Loss of interest in usual pleasures, Guilt, Despondent, Tearfulness, Feeling worthless/self pity Substance abuse history and/or treatment for substance abuse?: Yes Suicide prevention information given to non-admitted patients: Not applicable  Risk to Others within the past 6 months Homicidal Ideation: No Does patient have any lifetime risk of violence toward others beyond the six months prior to admission? : No Thoughts of Harm to Others: No Current Homicidal Intent: No Current Homicidal Plan: No Access to Homicidal Means: No Identified Victim: none History of harm to others?: No Assessment of Violence: None Noted Violent Behavior Description: pt denies violence - is calm Does patient have access to weapons?: No Criminal Charges Pending?: No Does patient have a court date: No Is patient on probation?: No  Psychosis Hallucinations: None noted Delusions: None noted  Mental Status  Report Appearance/Hygiene: Unremarkable (in street clothes) Eye Contact: Good Motor Activity: Freedom of movement Speech: Logical/coherent, Soft Level of Consciousness: Alert, Quiet/awake Mood: Depressed, Anxious, Sad, Anhedonia Affect: Appropriate to circumstance, Sad, Depressed, Anxious Anxiety Level: Panic Attacks Panic attack frequency: occasional  Most recent panic attack: 04/02/15 Thought Processes: Relevant, Coherent Judgement: Unimpaired Orientation: Person, Time, Situation, Place Obsessive Compulsive Thoughts/Behaviors: None  Cognitive Functioning Concentration: Decreased Memory: Remote Impaired, Recent Impaired IQ: Average Insight: Good Impulse Control: Poor Appetite: Poor Weight Loss:  (pt reports unknown amount of weight lost) Sleep: Decreased Total Hours of Sleep: 4 Vegetative Symptoms: None  ADLScreening [facility] Assessment Services) Patient's cognitive ability adequate to safely complete daily activities?: Yes Patient able to express need for assistance with ADLs?: Yes Independently performs ADLs?: Yes (appropriate for developmental age)  Prior Inpatient Therapy Prior Inpatient Therapy: Yes Prior Therapy Dates: July 2016 Prior Therapy Facilty/Provider(s): [facility] Reason for Treatment: SI and substance abuse  Prior Outpatient Therapy Prior Outpatient Therapy: No Prior Therapy Dates: na Prior Therapy Facilty/Provider(s): na Reason for Treatment: na Does patient have an ACCT team?: No Does patient have Intensive In-House Services?  : No Does patient have [facility] services? : No Does patient have P4CC services?: No  ADL Screening (condition at time of admission)  Patient's cognitive ability adequate to safely complete daily activities?: Yes Is the patient deaf or have difficulty hearing?: No Does the patient have difficulty seeing, even when wearing glasses/contacts?: No Does the patient have difficulty concentrating, remembering, or making decisions?:  Yes Patient able to express need for assistance with ADLs?: Yes Does the patient have difficulty dressing or bathing?: No Independently performs ADLs?: Yes (appropriate for developmental age) Does the patient have difficulty walking or climbing stairs?: No Weakness of Legs: None Weakness of Arms/Hands: None  Home Assistive Devices/Equipment Home Assistive Devices/Equipment: None    Abuse/Neglect Assessment (Assessment to be complete while patient is alone) Physical Abuse: Denies Verbal Abuse: Denies Sexual Abuse: Denies Exploitation of patient/patient's resources: Denies Self-Neglect: Denies     Merchant navy officer (For Healthcare) Does patient have an advance directive?: No Would patient like information on creating an advanced directive?: No - patient declined information    Additional Information 1:1 In Past 12 Months?: No CIRT Risk: No Elopement Risk: No Does patient have medical clearance?: No     Disposition:  Disposition Initial Assessment Completed for this Encounter: Yes Disposition of Patient: Inpatient treatment program (dr Dub Mikes accepts to 303-2) Type of inpatient treatment program: Adult  Thornell Sartorius 04/03/2015 7:06 PM

## 2015-04-03 NOTE — BHH Group Notes (Signed)
Adult Psychoeducational Group Note  Date:  04/03/2015 Time:  9:57 PM  Group Topic/Focus:  AA Group  Participation Level:  Did Not Attend  Participation Quality:  None  Affect:  NOne  Cognitive:  None  Insight: None  Engagement in Group:  None  Modes of Intervention:  Discussion and Education  Additional Comments:  Pt did not attend group.  Caroll Rancher A 04/03/2015, 9:57 PM

## 2015-04-04 ENCOUNTER — Encounter (HOSPITAL_COMMUNITY): Payer: Self-pay | Admitting: Psychiatry

## 2015-04-04 DIAGNOSIS — R45851 Suicidal ideations: Secondary | ICD-10-CM

## 2015-04-04 DIAGNOSIS — F1124 Opioid dependence with opioid-induced mood disorder: Secondary | ICD-10-CM

## 2015-04-04 DIAGNOSIS — F332 Major depressive disorder, recurrent severe without psychotic features: Principal | ICD-10-CM

## 2015-04-04 LAB — COMPREHENSIVE METABOLIC PANEL
ALBUMIN: 4.3 g/dL (ref 3.5–5.0)
ALK PHOS: 59 U/L (ref 38–126)
ALT: 12 U/L — ABNORMAL LOW (ref 17–63)
ANION GAP: 8 (ref 5–15)
AST: 15 U/L (ref 15–41)
BUN: 20 mg/dL (ref 6–20)
CALCIUM: 9 mg/dL (ref 8.9–10.3)
CHLORIDE: 102 mmol/L (ref 101–111)
CO2: 29 mmol/L (ref 22–32)
Creatinine, Ser: 0.99 mg/dL (ref 0.61–1.24)
GFR calc non Af Amer: >60 mL/min (ref >60)
GLUCOSE: 156 mg/dL — AB (ref 65–99)
POTASSIUM: 3.9 mmol/L (ref 3.5–5.1)
SODIUM: 139 mmol/L (ref 135–145)
Total Bilirubin: 0.4 mg/dL (ref 0.3–1.2)
Total Protein: 6.6 g/dL (ref 6.5–8.1)

## 2015-04-04 LAB — URINALYSIS, ROUTINE W REFLEX MICROSCOPIC
BILIRUBIN URINE: NEGATIVE
Glucose, UA: NEGATIVE mg/dL
Hgb urine dipstick: NEGATIVE
KETONES UR: NEGATIVE mg/dL
LEUKOCYTES UA: NEGATIVE
NITRITE: NEGATIVE
PH: 6 (ref 5.0–8.0)
PROTEIN: NEGATIVE mg/dL
Specific Gravity, Urine: 1.039 — ABNORMAL HIGH (ref 1.005–1.030)

## 2015-04-04 LAB — HEPATIC FUNCTION PANEL
ALBUMIN: 4.2 g/dL (ref 3.5–5.0)
ALK PHOS: 60 U/L (ref 38–126)
ALT: 12 U/L — AB (ref 17–63)
AST: 15 U/L (ref 15–41)
BILIRUBIN TOTAL: 0.4 mg/dL (ref 0.3–1.2)
Total Protein: 6.5 g/dL (ref 6.5–8.1)

## 2015-04-04 LAB — RAPID URINE DRUG SCREEN, HOSP PERFORMED
AMPHETAMINES: NOT DETECTED
BARBITURATES: NOT DETECTED
Benzodiazepines: NOT DETECTED
Cocaine: NOT DETECTED
OPIATES: NOT DETECTED
TETRAHYDROCANNABINOL: NOT DETECTED

## 2015-04-04 LAB — CBC
HCT: 38.7 % — ABNORMAL LOW (ref 39.0–52.0)
HEMOGLOBIN: 13.1 g/dL (ref 13.0–17.0)
MCH: 32.2 pg (ref 26.0–34.0)
MCHC: 33.9 g/dL (ref 30.0–36.0)
MCV: 95.1 fL (ref 78.0–100.0)
PLATELETS: 155 10*3/uL (ref 150–400)
RBC: 4.07 MIL/uL — ABNORMAL LOW (ref 4.22–5.81)
RDW: 13.3 % (ref 11.5–15.5)
WBC: 6.4 10*3/uL (ref 4.0–10.5)

## 2015-04-04 LAB — TSH: TSH: 0.492 u[IU]/mL (ref 0.350–4.500)

## 2015-04-04 MED ORDER — BUPROPION HCL ER (XL) 150 MG PO TB24
150.0000 mg | ORAL_TABLET | Freq: Every day | ORAL | Status: DC
  Administered 2015-04-04 – 2015-04-06 (×3): 150 mg via ORAL
  Filled 2015-04-04 (×2): qty 1
  Filled 2015-04-04: qty 7
  Filled 2015-04-04 (×4): qty 1

## 2015-04-04 NOTE — Tx Team (Signed)
Interdisciplinary Treatment Plan Update (Adult)  Date:  04/04/2015  Time Reviewed:  8:26 AM   Progress in Treatment: Attending groups: No. New to unit. Continuing to assess.  Participating in groups:  No. Taking medication as prescribed:  Yes. Tolerating medication:  Yes. Family/Significant othe contact made:  SPE required for this pt.  Patient understands diagnosis:  Yes. and As evidenced by:  seeking treatment for opiate/heroin/cocaine abuse, depression, and increased anxiety/medication stabilization. Discussing patient identified problems/goals with staff:  Yes. Medical problems stabilized or resolved:  Yes. Denies suicidal/homicidal ideation: Yes. Issues/concerns per patient self-inventory:  Other:  Discharge Plan or Barriers: CSW assessing for appropriate referrals. Pt was admitted for similar issues in July 2016 and had been following up at [facility] in [facility]. Pt is requesting [facility] referral. CSW faxed this morning.   Reason for Continuation of Hospitalization: Depression Medication stabilization Withdrawal symptoms  Comments:  Geoffrey [name] is an 25 y.o. male. Pt presents voluntarily to [facility] for assessment.His mood is depressed and anxious, and his affect is mood congruent. He endorses SI. Pt says, "I have suicidal thoughts and they are freaking me out." He reports he got a new job two days ago and his depressvie sxs are preventing him from doing well at the job. He endorses hopelessness, anhedonia, fatigue, insomnia, tearfulness and worthlessness. He reports severe anxiety and says he had a panic attack last night. Pt reports his dad died from a drug overdose when dad was 7 yo. Pt reports that it has been exactly one year since his wife left him. He has two children who live with estranged wife. Pt denies HI. He denies [facility] and no delusions noted. Pt reports he has been abusing opiates (Oxycontin) and has recently begun using heroin again by snorting it. He says he has  also started using cocaine. Pt says last use of heroin and coke was 03/31/15. Pt says, "I want to get the right treatment." Pt says he wasn't completely honest about his depressive sxs last time he was admitted here July 2016 and he wants to be honest this time. He says he knows he needs help and says he is motivated to stay clean and sober. Pt reports he takes Adderall as prescribed but his friends sometime steal his meds. He says the Adderall works for his anxiety, but he says when he runs out of the med he then wants to use opiates. Pt sts he doesn't even like taking opiates, he is doing it now so he won't get sick. Pt denies any upcoming court dates. He says he has a traffic court date sometime in late March. Opioid Use Disorder, Severe Major Depressive Disorder, Recurrent, Moderate  Estimated length of stay:  3-5 days   New goal(s): to develop effective aftercare plan.   Additional Comments:  Patient and CSW reviewed pt's identified goals and treatment plan. Patient verbalized understanding and agreed to treatment plan. CSW reviewed [facility] "Discharge Process and Patient Involvement" Form. Pt verbalized understanding of information provided and signed form.    Review of initial/current patient goals per problem list:  1. Goal(s): Patient will participate in aftercare plan  Met: No.   Target date: at discharge  As evidenced by: Patient will participate within aftercare plan AEB aftercare provider and housing plan at discharge being identified.  1/25: CSW assessing for appropriate referrals. [facility] referral faxed this morning per pt request.   2. Goal (s): Patient will exhibit decreased depressive symptoms and suicidal ideations.  Met: No.  Target date: at discharge  As evidenced by: Patient will utilize self rating of depression at 3 or below and demonstrate decreased signs of depression or be deemed stable for discharge by MD.  1/25: Pt rates depression as high this morning. He  denies SI/HI/AVH.   3. Goal(s): Patient will demonstrate decreased signs of withdrawal due to substance abuse  Met:No.   Target date:at discharge   As evidenced by: Patient will produce a CIWA/COWS score of 0, have stable vitals signs, and no symptoms of withdrawal.  1/25: Pt reports moderate withdrawals with COWS of 9 and low sitting BP.   Attendees: Patient:   04/04/2015 8:26 AM   Family:   04/04/2015 8:26 AM   Physician:  Dr. Carlton Adam, MD 04/04/2015 8:26 AM   Nursing:   Francene Boyers RN 04/04/2015 8:26 AM   Clinical Social Worker: Maxie Better, LCSW 04/04/2015 8:26 AM   Clinical Social Worker: Erasmo Downer Drinkard LCSWA; Peri Maris LCSWA 04/04/2015 8:26 AM   Other:  Gerline Legacy Nurse Case Manager 04/04/2015 8:26 AM   Other:  Agustina Caroli NP  04/04/2015 8:26 AM   Other:   04/04/2015 8:26 AM   Other:  04/04/2015 8:26 AM   Other:  04/04/2015 8:26 AM   Other:  04/04/2015 8:26 AM    04/04/2015 8:26 AM    04/04/2015 8:26 AM    04/04/2015 8:26 AM    04/04/2015 8:26 AM    Scribe for Treatment Team:   Maxie Better, LCSW 04/04/2015 8:26 AM

## 2015-04-04 NOTE — BHH Suicide Risk Assessment (Signed)
[facility] Admission Suicide Risk Assessment   Nursing information obtained from:  Patient Demographic factors:  Male, Caucasian, Unemployed, Access to firearms Current Mental Status:  Suicidal ideation indicated by patient Loss Factors:  Decrease in vocational status, Loss of significant relationship, Financial problems / change in socioeconomic status Historical Factors:  NA Risk Reduction Factors:  Living with another person, especially a relative  Total Time spent with patient: 45 minutes Principal Problem: MDD (major depressive disorder), recurrent episode, severe (HCC) Diagnosis:   Patient Active Problem List   Diagnosis Date Noted  . Substance induced mood disorder (HCC) [F19.94] 04/03/2015  . Opioid dependence with opioid-induced mood disorder (HCC) [F11.24]   . MDD (major depressive disorder), recurrent episode, severe (HCC) [F33.2] 09/12/2014   Subjective Data: see admission H and P  Continued Clinical Symptoms:  Alcohol Use Disorder Identification Test Final Score (AUDIT): 0 The "Alcohol Use Disorders Identification Test", Guidelines for Use in Primary Care, Second Edition.  World Science writer [facility]). Score between 0-7:  no or low risk or alcohol related problems. Score between 8-15:  moderate risk of alcohol related problems. Score between 16-19:  high risk of alcohol related problems. Score 20 or above:  warrants further diagnostic evaluation for alcohol dependence and treatment.   CLINICAL FACTORS:   Depression:   Comorbid alcohol abuse/dependence Severe Alcohol/Substance Abuse/Dependencies   Psychiatric Specialty Exam: ROS  Blood pressure 99/64, pulse 101, temperature 97.9 F (36.6 C), temperature source Oral, resp. rate 18, height  (1.93 m), weight 75.297 kg (166 lb).Body mass index is 20.21 kg/(m^2).   COGNITIVE FEATURES THAT CONTRIBUTE TO RISK:  Closed-mindedness, Polarized thinking and Thought constriction (tunnel vision)    SUICIDE RISK:   Moderate:  Frequent suicidal ideation with limited intensity, and duration, some specificity in terms of plans, no associated intent, good self-control, limited dysphoria/symptomatology, some risk factors present, and identifiable protective factors, including available and accessible social support.  PLAN OF CARE: see admission H and P  I certify that inpatient services furnished can reasonably be expected to improve the patient's condition.   Rachael Fee, MD 04/04/2015, 5:01 PM
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
## 2015-04-04 NOTE — [facility] Group Notes (Signed)
[facility] LCSW Group Therapy  04/04/2015 3:33 PM  Type of Therapy:  Group Therapy  Participation Level:  Did Not Attend-pt invited. Chose to rest in bed.   Summary of Progress/Problems: Today's Topic: Overcoming Obstacles. Patients identified one short term goal and potential obstacles in reaching this goal. Patients processed barriers involved in overcoming these obstacles. Patients identified steps necessary for overcoming these obstacles and explored motivation (internal and external) for facing these difficulties head on.   Smart, Luvinia Lucy LCSW 04/04/2015, 3:33 PM

## 2015-04-04 NOTE — Progress Notes (Signed)
Pt did not attend NA group 

## 2015-04-04 NOTE — Progress Notes (Signed)
D: Geoffrey [name] remained in room for most of evening, but did come to the med window. He denied SI/HI/AVH but endorsed back pain. He did not attend group.  A: Meds given as ordered, including PRNs when indicated. Urged patient to drink water to support BP. Q15 safety checks maintained. Support/encouragement offered. R: Pt remains free from harm and continues with treatment. Will continue to monitor for needs/safety.
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
## 2015-04-04 NOTE — Progress Notes (Signed)
NUTRITION ASSESSMENT  Pt identified as at risk on the Malnutrition Screen Tool  INTERVENTION: 1. Educated patient on the importance of nutrition and encouraged intake of food and beverages. 2. Discussed weight goals. 3. Supplements: continue Ensure Enlive po BID, each supplement provides 350 kcal and 20 grams of protein   NUTRITION DIAGNOSIS: Unintentional weight loss related to sub-optimal intake as evidenced by pt report.   Goal: Pt to meet >/= 90% of their estimated nutrition needs.  Monitor:  PO intake  Assessment:  Pt seen for MST. Pt admitted for polysubstance abuse, SI, and depression. Pt already has Ensure ordered BID.  Per report, he has lost 7 lbs (4% body weight) in the past 6 months which is not significant for time frame.  25 y.o. male  Height: Ht Readings from Last 1 Encounters:  04/03/15  (1.93 m)    Weight: Wt Readings from Last 1 Encounters:  04/03/15 166 lb (75.297 kg)    Weight Hx: Wt Readings from Last 10 Encounters:  04/03/15 166 lb (75.297 kg)  09/12/14 173 lb (78.472 kg)  11/17/11 175 lb (79.379 kg)  12/02/10 171 lb 2 oz (77.622 kg)    BMI:  Body mass index is 20.21 kg/(m^2). Pt meets criteria for normal weight based on current BMI.  Estimated Nutritional Needs: Kcal: 25-30 kcal/kg Protein: > 1 gram protein/kg Fluid: 1 ml/kcal  Diet Order: Diet regular Room service appropriate?: Yes; Fluid consistency:: Thin Pt is also offered choice of unit snacks mid-morning and mid-afternoon.  Pt is eating as desired.   Lab results and medications reviewed.      [name], RD, LDN Inpatient Clinical Dietitian Pager # [phone] After hours/weekend pager # [phone]

## 2015-04-04 NOTE — Progress Notes (Signed)
D: Pt presents anxious on approach. Pt rates depression 8/10. Anxiety 7/10. Hopeless 9/10. Pt denies suicidal thoughts. Pt reports withdrawal symptoms of: cravings, tremors, cramping and anxiety. Pt given prn meds for withdrawal symptoms. Pt stated to writer to that he's been here before for detox but haven't used nearly as much as he has in the past.  A: Medications administered as ordered per MD. Verbal support given. Pt encouraged to attend groups. 15 minute checks performed for safety. R: Pt verbalized understanding of med regimen. Pt receptive to tx.

## 2015-04-04 NOTE — [facility] Group Notes (Signed)
[facility] LCSW Aftercare Discharge Planning Group Note   04/04/2015 11:22 AM  Participation Quality:  Appropriate/minimal   Mood/Affect:  Flat  Depression Rating:  7  Anxiety Rating:  7  Thoughts of Suicide:  No Will you contract for safety?   NA  Current AVH:  No  Plan for Discharge/Comments:  Pt reports that he was hoping for [facility] referral and has issue with opiates and cocaine that he was not honest about during previous admission last year. Pt reports that he lives in [facility] and had hx of Ar[facility] Medici for outpatient care. Minimal in interaction.   Transportation Means: unknown at this time.   Supports: unknown    Counselling psychologist, Conservation officer, nature

## 2015-04-05 ENCOUNTER — Other Ambulatory Visit: Payer: Self-pay

## 2015-04-05 MED ORDER — DICYCLOMINE HCL 20 MG PO TABS
20.0000 mg | ORAL_TABLET | Freq: Three times a day (TID) | ORAL | Status: DC
  Administered 2015-04-05 – 2015-04-06 (×3): 20 mg via ORAL
  Filled 2015-04-05 (×12): qty 1

## 2015-04-05 MED ORDER — NICOTINE 21 MG/24HR TD PT24
21.0000 mg | MEDICATED_PATCH | Freq: Every day | TRANSDERMAL | Status: DC
  Administered 2015-04-06: 21 mg via TRANSDERMAL
  Filled 2015-04-05 (×3): qty 1

## 2015-04-05 MED ORDER — AMPHETAMINE-DEXTROAMPHETAMINE 10 MG PO TABS
20.0000 mg | ORAL_TABLET | Freq: Two times a day (BID) | ORAL | Status: DC
  Administered 2015-04-05 – 2015-04-06 (×3): 20 mg via ORAL
  Filled 2015-04-05 (×3): qty 2

## 2015-04-05 MED ORDER — NICOTINE 21 MG/24HR TD PT24
MEDICATED_PATCH | TRANSDERMAL | Status: AC
  Administered 2015-04-05: 09:00:00
  Filled 2015-04-05: qty 1

## 2015-04-05 NOTE — [facility] Group Notes (Signed)
[facility] LCSW Group Therapy  04/05/2015 3:23 PM  Type of Therapy:  Group Therapy  Participation Level:  Did Not Attend-pt chose to remain in bed.   Summary of Progress/Problems:  Finding Balance in Life. Today's group focused on defining balance in one's own words, identifying things that can knock one off balance, and exploring healthy ways to maintain balance in life. Group members were asked to provide an example of a time when they felt off balance, describe how they handled that situation,and process healthier ways to regain balance in the future. Group members were asked to share the most important tool for maintaining balance that they learned while at [facility] and how they plan to apply this method after discharge.   Smart, Brenden Rudman LCSW 04/05/2015, 3:23 PM

## 2015-04-05 NOTE — Progress Notes (Signed)
D    Pt endorses some depression and anxiety    Pt reports not being able to swallow large pills therefore did not want certain medications that would have helped relieve some of his symptoms A   Verbal support given   Medications administered and effectiveness monitored   Q 15 min checks  R   Pt safe at present

## 2015-04-05 NOTE — Progress Notes (Signed)
[name] MD Progress Note  04/05/2015 5:49 PM Geoffrey [name]  MRN:  [id number] Subjective:  Geoffrey [name] states that he is still having issues with the depression. Took the Wellbutrin but states he is going to have a hard time while waiting the several weeks before it works. He states he wants to get clean. He is still having some withdrawal symptoms. He is not sure if he is going to a residential treatment program. He can go back with his GF ( has known her for 2 months randomly met through face book. She is 28) he states she does not use. She admits that after the separation he has been very lonely. Principal Problem: MDD (major depressive disorder), recurrent episode, severe ([place]) Diagnosis:   Patient Active Problem List   Diagnosis Date Noted  . Substance induced mood disorder ([place]) [F19.94] 04/03/2015  . Opioid dependence with opioid-induced mood disorder ([place]) [F11.24]   . MDD (major depressive disorder), recurrent episode, severe ([place]) [F33.2] 09/12/2014   Total Time spent with patient: 30 minutes  Past Psychiatric History: see admission H and P  Past Medical History:  Past Medical History  Diagnosis Date  . Back pain, lumbosacral   . Anxiety     Past Surgical History  Procedure Laterality Date  . Arm surgery     Family History:  Family History  Problem Relation Age of Onset  . Multiple sclerosis Mother    Family Psychiatric  History: see admission H and P Social History:  History  Alcohol Use No     History  Drug Use  . Yes  . Special: Heroin, "Crack" cocaine, Hydrocodone    Social History   Social History  . Marital Status: Legally Separated    Spouse Name: N/A  . Number of Children: N/A  . Years of Education: N/A   Social History Main Topics  . Smoking status: Current Every Day Smoker -- 1.00 packs/day for 7 years    Types: Cigarettes  . Smokeless tobacco: Never Used  . Alcohol Use: No  . Drug Use: Yes    Special: Heroin, "Crack" cocaine, Hydrocodone  .  Sexual Activity: Yes   Other Topics Concern  . None   Social History Narrative   Additional Social History:    Pain Medications: pt reports abusing oxycontin Prescriptions: oxycontin Over the Counter: pt denies abuse History of alcohol / drug use?: Yes Negative Consequences of Use: Museum/gallery curator, Work / Youth worker Withdrawal Symptoms: Other (Comment), Tremors (anxiety) Name of Substance 1: opiates - oxycontin, Heroin (snorts) 1 - Age of First Use: 20 1 - Amount (size/oz): varies 1 - Frequency: daily 1 - Duration: oxycontin: "2 months"; heroin: "recently"  1 - Last Use / Amount: 04/03/15 Name of Substance 2: cocaine 2 - Duration: "about twice recently"  2 - Last Use / Amount: 03/31/15                Sleep: Fair  Appetite:  Fair  Current Medications: Current Facility-Administered Medications  Medication Dose Route Frequency Provider Last Rate Last Dose  . acetaminophen (TYLENOL) tablet 650 mg  650 mg Oral Q6H PRN Laverle Hobby, PA-C   650 mg at 04/04/15 1902  . alum & mag hydroxide-simeth (MAALOX/MYLANTA) 200-200-20 MG/5ML suspension 30 mL  30 mL Oral Q4H PRN Laverle Hobby, PA-C      . amphetamine-dextroamphetamine (ADDERALL) tablet 20 mg  20 mg Oral BID WC Nicholaus Bloom, MD   20 mg at 04/05/15 1448  . buPROPion (WELLBUTRIN XL)  24 hr tablet 150 mg  150 mg Oral Daily Nicholaus Bloom, MD   150 mg at 04/05/15 0801  . cloNIDine (CATAPRES) tablet 0.1 mg  0.1 mg Oral QID Laverle Hobby, PA-C   0.1 mg at 04/05/15 1650   Followed by  . [START ON 04/06/2015] cloNIDine (CATAPRES) tablet 0.1 mg  0.1 mg Oral BH-qamhs Spencer E Simon, PA-C       Followed by  . [START ON 04/09/2015] cloNIDine (CATAPRES) tablet 0.1 mg  0.1 mg Oral QAC breakfast Laverle Hobby, PA-C      . dicyclomine (BENTYL) tablet 20 mg  20 mg Oral TID AC & HS Nicholaus Bloom, MD   20 mg at 04/05/15 1650  . feeding supplement (ENSURE ENLIVE) (ENSURE ENLIVE) liquid 237 mL  237 mL Oral BID BM Nicholaus Bloom, MD   237 mL at  04/05/15 1450  . hydrOXYzine (ATARAX/VISTARIL) tablet 50 mg  50 mg Oral Q6H PRN Laverle Hobby, PA-C   50 mg at 04/05/15 1652  . loperamide (IMODIUM) capsule 2-4 mg  2-4 mg Oral PRN Laverle Hobby, PA-C      . magnesium hydroxide (MILK OF MAGNESIA) suspension 30 mL  30 mL Oral Daily PRN Laverle Hobby, PA-C      . methocarbamol (ROBAXIN) tablet 500 mg  500 mg Oral Q8H PRN Laverle Hobby, PA-C      . naproxen (NAPROSYN) tablet 500 mg  500 mg Oral BID PRN Laverle Hobby, PA-C   500 mg at 04/04/15 2149  . [START ON 04/06/2015] nicotine (NICODERM CQ - dosed in mg/24 hours) patch 21 mg  21 mg Transdermal Daily Nicholaus Bloom, MD      . nicotine polacrilex (NICORETTE) gum 2 mg  2 mg Oral PRN Laverle Hobby, PA-C   2 mg at 04/03/15 2125  . ondansetron (ZOFRAN-ODT) disintegrating tablet 4 mg  4 mg Oral Q6H PRN Laverle Hobby, PA-C   4 mg at 04/04/15 1902  . traZODone (DESYREL) tablet 150 mg  150 mg Oral QHS Laverle Hobby, PA-C   150 mg at 04/04/15 2149    Lab Results:  Results for orders placed or performed during the hospital encounter of 04/03/15 (from the past 48 hour(s))  CBC     Status: Abnormal   Collection Time: 04/04/15  6:05 AM  Result Value Ref Range   WBC 6.4 4.0 - 10.5 K/uL   RBC 4.07 (L) 4.22 - 5.81 MIL/uL   Hemoglobin 13.1 13.0 - 17.0 g/dL   HCT 38.7 (L) 39.0 - 52.0 %   MCV 95.1 78.0 - 100.0 fL   MCH 32.2 26.0 - 34.0 pg   MCHC 33.9 30.0 - 36.0 g/dL   RDW 13.3 11.5 - 15.5 %   Platelets 155 150 - 400 K/uL    Comment: Performed at [facility]  Comprehensive metabolic panel     Status: Abnormal   Collection Time: 04/04/15  6:05 AM  Result Value Ref Range   Sodium 139 135 - 145 mmol/L   Potassium 3.9 3.5 - 5.1 mmol/L   Chloride 102 101 - 111 mmol/L   CO2 29 22 - 32 mmol/L   Glucose, Bld 156 (H) 65 - 99 mg/dL   BUN 20 6 - 20 mg/dL   Creatinine, Ser 0.99 0.61 - 1.24 mg/dL   Calcium 9.0 8.9 - 10.3 mg/dL   Total Protein 6.6 6.5 - 8.1 g/dL   Albumin 4.3  3.5 - 5.0 g/dL   AST 15 15 - 41 U/L   ALT 12 (L) 17 - 63 U/L   Alkaline Phosphatase 59 38 - 126 U/L   Total Bilirubin 0.4 0.3 - 1.2 mg/dL   GFR calc non Af Amer >60 >60 mL/min   GFR calc Af Amer >60 >60 mL/min    Comment: (NOTE) The eGFR has been calculated using the CKD EPI equation. This calculation has not been validated in all clinical situations. eGFR's persistently <60 mL/min signify possible Chronic Kidney Disease.    Anion gap 8 5 - 15    Comment: Performed at [facility]  Hepatic function panel     Status: Abnormal   Collection Time: 04/04/15  6:05 AM  Result Value Ref Range   Total Protein 6.5 6.5 - 8.1 g/dL   Albumin 4.2 3.5 - 5.0 g/dL   AST 15 15 - 41 U/L   ALT 12 (L) 17 - 63 U/L   Alkaline Phosphatase 60 38 - 126 U/L   Total Bilirubin 0.4 0.3 - 1.2 mg/dL   Bilirubin, Direct <0.1 (L) 0.1 - 0.5 mg/dL   Indirect Bilirubin NOT CALCULATED 0.3 - 0.9 mg/dL    Comment: Performed at [facility]  TSH     Status: None   Collection Time: 04/04/15  6:05 AM  Result Value Ref Range   TSH 0.492 0.350 - 4.500 uIU/mL    Comment: Performed at [facility]  Urinalysis, Routine w reflex microscopic (not at [facility])     Status: Abnormal   Collection Time: 04/04/15  6:21 AM  Result Value Ref Range   Color, Urine AMBER (A) YELLOW    Comment: BIOCHEMICALS MAY BE AFFECTED BY COLOR   APPearance CLEAR CLEAR   Specific Gravity, Urine 1.039 (H) 1.005 - 1.030   pH 6.0 5.0 - 8.0   Glucose, UA NEGATIVE NEGATIVE mg/dL   Hgb urine dipstick NEGATIVE NEGATIVE   Bilirubin Urine NEGATIVE NEGATIVE   Ketones, ur NEGATIVE NEGATIVE mg/dL   Protein, ur NEGATIVE NEGATIVE mg/dL   Nitrite NEGATIVE NEGATIVE   Leukocytes, UA NEGATIVE NEGATIVE    Comment: MICROSCOPIC NOT DONE ON URINES WITH NEGATIVE PROTEIN, BLOOD, LEUKOCYTES, NITRITE, OR GLUCOSE <1000 mg/dL. Performed at [facility]   Urine rapid drug screen (hosp performed)not at  [facility]     Status: None   Collection Time: 04/04/15  6:22 AM  Result Value Ref Range   Opiates NONE DETECTED NONE DETECTED   Cocaine NONE DETECTED NONE DETECTED   Benzodiazepines NONE DETECTED NONE DETECTED   Amphetamines NONE DETECTED NONE DETECTED   Tetrahydrocannabinol NONE DETECTED NONE DETECTED   Barbiturates NONE DETECTED NONE DETECTED    Comment:        DRUG SCREEN FOR MEDICAL PURPOSES ONLY.  IF CONFIRMATION IS NEEDED FOR ANY PURPOSE, NOTIFY LAB WITHIN 5 DAYS.        LOWEST DETECTABLE LIMITS FOR URINE DRUG SCREEN Drug Class       Cutoff (ng/mL) Amphetamine      1000 Barbiturate      200 Benzodiazepine   035 Tricyclics       597 Opiates          300 Cocaine          300 THC              50 Performed at [facility]     Physical Findings: AIMS: Facial and Oral Movements Muscles of  Facial Expression: None, normal Lips and Perioral Area: None, normal Jaw: None, normal Tongue: None, normal,Extremity Movements Upper (arms, wrists, hands, fingers): None, normal Lower (legs, knees, ankles, toes): None, normal, Trunk Movements Neck, shoulders, hips: None, normal, Overall Severity Severity of abnormal movements (highest score from questions above): None, normal Incapacitation due to abnormal movements: None, normal Patient's awareness of abnormal movements (rate only patient's report): No Awareness, Dental Status Current problems with teeth and/or dentures?: No Does patient usually wear dentures?: No  CIWA:  CIWA-Ar Total: 7 COWS:  COWS Total Score: 4  Musculoskeletal: Strength & Muscle Tone: within normal limits Gait & Station: normal Patient leans: normal  Psychiatric Specialty Exam: Review of Systems  Constitutional: Positive for malaise/fatigue.  HENT: Negative.   Eyes: Negative.   Respiratory: Negative.   Cardiovascular: Negative.   Gastrointestinal: Negative.   Genitourinary: Negative.   Musculoskeletal: Positive for back pain.  Skin:  Negative.   Neurological: Positive for weakness.  Endo/Heme/Allergies: Negative.   Psychiatric/Behavioral: Positive for depression and substance abuse. The patient is nervous/anxious.     Blood pressure 130/79, pulse 91, temperature 97.9 F (36.6 C), temperature source Oral, resp. rate 17, height 6' 4"  (1.93 m), weight 75.297 kg (166 lb).Body mass index is 20.21 kg/(m^2).  General Appearance: Fairly Groomed  Engineer, water::  Poor  Speech:  Clear and Coherent  Volume:  Decreased  Mood:  Anxious and Depressed  Affect:  Depressed and Restricted  Thought Process:  Coherent and Goal Directed  Orientation:  Full (Time, Place, and Person)  Thought Content:  symptoms events worries concerns  Suicidal Thoughts:  No  Homicidal Thoughts:  No  Memory:  Immediate;   Fair Recent;   Fair Remote;   Fair  Judgement:  Fair  Insight:  Present  Psychomotor Activity:  Decreased  Concentration:  Fair  Recall:  [employer] of Knowledge:Fair  Language: Fair  Akathisia:  No  Handed:  Right  AIMS (if indicated):     Assets:  Desire for Improvement Housing  ADL's:  Intact  Cognition: WNL  Sleep:  Number of Hours: 6.25   Treatment Plan Summary: Daily contact with patient to assess and evaluate symptoms and progress in treatment and Medication management Supportive approach/coping skills Opioid dependence; continue the clonidine detox protocol/work a relapse prevention plan Depression; continue the Wellbutrin at XL 150 mg and plan to increase to 300 mg in couple of days Inattentiveness distractibility; resume the Adderall at 20 mg BID Work with CBT/mindfulness/grief/loss Ramonica Grigg A 04/05/2015, 5:49 PM
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
## 2015-04-05 NOTE — Progress Notes (Signed)
Patient up and visible in the milieu. Flat, anxious both in affect and mood. Complaining of withdrawal symptoms - anxiety, severe stomach cramping, head and backache. States tylenol, robaxin not of any help therefore refusing. Rates his depression and anxiety both at a 7/10, hopelessness at an 8/10. Reports goal is to work on "meds" and he plans to meet this goal by "being patient." Medicated per orders. Bentyl and vistaril given prn. Fluids encouraged as BP low at lunch time. Fall precautions reviewed. Emotional support provided. Reviewed self inventory. On reassess, patient is lying down with moderate anxiety relief however states stomach cramping is only slightly decreased. Will continue to monitor. Patient denies SI/HI and remains safe on level III obs. Geoffrey [name]

## 2015-04-05 NOTE — [facility] Group Notes (Signed)
[facility] Group Notes:  (Nursing/MHT/Case Management/Adjunct)  Date:  04/05/2015  Time:  0900  Type of Therapy:  Nurse Education - Healthy Lifestyle Changes  Participation Level:  Active  Participation Quality:  Attentive  Affect:  Anxious and Blunted  Cognitive:  Alert  Insight:  Limited  Engagement in Group:  Engaged  Modes of Intervention:  Education and Support  Summary of Progress/Problems: Patient attended nursing group and was attentive.  Merian Capron [facility] 04/05/2015, 0930

## 2015-04-05 NOTE — Plan of Care (Signed)
Problem: Diagnosis: Increased Risk For Suicide Attempt Goal: STG-Patient Will Comply With Medication Regime Outcome: Progressing Patient has been med compliant, requests prn's when needed.  Problem: Ineffective individual coping Goal: STG: Patient will remain free from self harm Outcome: Progressing Patient has not engaged in self harm. Denies thoughts, urges to do so.

## 2015-04-05 NOTE — Progress Notes (Signed)
Pt did not attend wrap-up group   

## 2015-04-06 DIAGNOSIS — F332 Major depressive disorder, recurrent severe without psychotic features: Secondary | ICD-10-CM | POA: Insufficient documentation

## 2015-04-06 MED ORDER — AMPHETAMINE-DEXTROAMPHETAMINE 20 MG PO TABS: 20.0000 mg | ORAL_TABLET | Freq: Two times a day (BID) | ORAL | Status: AC

## 2015-04-06 MED ORDER — NICOTINE POLACRILEX 2 MG MT GUM: 2.0000 mg | CHEWING_GUM | OROMUCOSAL | Status: AC | PRN

## 2015-04-06 MED ORDER — TRAZODONE HCL 150 MG PO TABS: 150.0000 mg | ORAL_TABLET | Freq: Every day | ORAL | Status: AC

## 2015-04-06 MED ORDER — HYDROXYZINE HCL 50 MG PO TABS: 50.0000 mg | ORAL_TABLET | Freq: Four times a day (QID) | ORAL | Status: AC | PRN

## 2015-04-06 MED ORDER — NICOTINE 21 MG/24HR TD PT24: 21.0000 mg | MEDICATED_PATCH | Freq: Every day | TRANSDERMAL | Status: AC

## 2015-04-06 MED ORDER — BUPROPION HCL ER (XL) 150 MG PO TB24: 150.0000 mg | ORAL_TABLET | Freq: Every day | ORAL | Status: AC

## 2015-04-06 NOTE — Progress Notes (Signed)
  [facility] Adult Case Management Discharge Plan :  Will you be returning to the same living situation after discharge:  Yes,  home  At discharge, do you have transportation home?: Yes,  girlfriend Do you have the ability to pay for your medications: Yes,  mental health  Release of information consent forms completed and submitted to medical records by CSW.  Patient to Follow up at: Follow-up Information    Follow up with [facility] On 04/09/2015.   Why:  Walk in between 8am-10am for hospital follow-up, medication management, and assessment for counseling services. Please bring photo ID, social security card, and proof of income (if any).    Contact information:   [address] [place], [place] [address] Phone: [phone] Fax: [phone]      Next level of care provider has access to [facility] Link:no  Safety Planning and Suicide Prevention discussed: Yes,  SPE completed with pt, as he refused to consent for family contact.   Have you used any form of tobacco in the last 30 days? (Cigarettes, Smokeless Tobacco, Cigars, and/or Pipes): Yes  Has patient been referred to the Quitline?: Patient refused referral  Patient has been referred for addiction treatment: Yes-see above   Smart, Tyee Vandevoorde LCSW 04/06/2015, 4:29 PM

## 2015-04-06 NOTE — Progress Notes (Signed)
Patient ID: Geoffrey [name], male   DOB: 1990-07-02, 25 y.o.   MRN: [id number]  D: Patient had a flat affect on approach this am. Reports sleeping and appetite good and still drinking an Ensure here and there also. Currently reports withdrawal symptoms "mild" and COW was a 3. Contracts for safety on the unit. Wanting to discharge today but physician wants him to stay and be reassessed tomorrow. Gives depression and hopelessness "0" and anxiety "1" A: Staff will monitor on q 15 minute checks, follow treatment plan, and give medications as ordered. R: Cooperative on the unit

## 2015-04-06 NOTE — [facility] Suicide Risk Assessment (Signed)
[facility] Discharge Suicide Risk Assessment   Principal Problem: MDD (major depressive disorder), recurrent episode, severe (HCC) Discharge Diagnoses:  Patient Active Problem List   Diagnosis Date Noted  . Substance induced mood disorder (HCC) [F19.94] 04/03/2015  . Opioid dependence with opioid-induced mood disorder (HCC) [F11.24]   . MDD (major depressive disorder), recurrent episode, severe (HCC) [F33.2] 09/12/2014    Total Time spent with patient: 20 minutes  Musculoskeletal: Strength & Muscle Tone: within normal limits Gait & Station: normal Patient leans: normal  Psychiatric Specialty Exam: Review of Systems  Constitutional: Negative.   HENT: Negative.   Eyes: Negative.   Respiratory: Negative.   Cardiovascular: Negative.   Gastrointestinal: Negative.   Genitourinary: Negative.   Musculoskeletal: Negative.   Skin: Negative.   Neurological: Negative.   Endo/Heme/Allergies: Negative.   Psychiatric/Behavioral: Positive for substance abuse.    Blood pressure 133/80, pulse 89, temperature 97.6 F (36.4 C), temperature source Oral, resp. rate 16, height  (1.93 m), weight 75.297 kg (166 lb).Body mass index is 20.21 kg/(m^2).  General Appearance: Fairly Groomed  Patent attorney::  Fair  Speech:  Clear and Coherent409  Volume:  Normal  Mood:  Euthymic  Affect:  Appropriate  Thought Process:  Coherent and Goal Directed  Orientation:  Full (Time, Place, and Person)  Thought Content:  plans as he goes on, relapse prevention plan  Suicidal Thoughts:  No  Homicidal Thoughts:  No  Memory:  Immediate;   Fair Recent;   Fair Remote;   Fair  Judgement:  Fair  Insight:  Present  Psychomotor Activity:  Normal  Concentration:  Fair  Recall:  [employer] of Knowledge:Fair  Language: Fair  Akathisia:  No  Handed:  Right  AIMS (if indicated):     Assets:  Desire for Improvement Housing Social Support Talents/Skills  Sleep:  Number of Hours: 1.25  Cognition: WNL  ADL's:   Intact  Geoffrey Geoffrey [name] is in full contact with reality. There are no active S/S of withdrawal. There are no active SI plans or intent. 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 The Adderall has made Geoffrey [name] big difference as evidenced  while in the unit. He states that with this medication he can stay focused on task be productive and accomplish things. States he is getting out of the depressed state he found himself in. He understands that he has the divorce coming his way but states he feels ready to address it. He states he has Geoffrey [name] lot of support from his GF. He plans to continue to be Geoffrey [name] presence in his kids lives. 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 States he is committed to abstinence. Given that his father died of an OD when he was 62 and given what he had to go trough because of his death, he would not want his kids to go trough the same things Mental Status Per Nursing Assessment::   On Admission:  Suicidal ideation indicated by patient  Demographic Factors:  Male, Adolescent or young adult and Caucasian  Loss Factors: Loss of significant relationship and Financial problems/change in socioeconomic status  Historical Factors: Family history of mental illness or substance abuse  Risk Reduction Factors:   Sense of responsibility to family, Living with another person, especially Geoffrey [name] relative and Positive social support  Continued Clinical Symptoms:  Depression:   Comorbid alcohol abuse/dependence Alcohol/Substance Abuse/Dependencies  Cognitive Features  That Contribute To Risk:  None    Suicide Risk:  Minimal: No identifiable suicidal ideation.  Patients presenting with no risk factors but with morbid ruminations; may be classified as minimal risk based on the severity of the depressive symptoms  Follow-up Information    Follow up with Arna Medici On 04/09/2015.   Why:  Walk  in between 8am-10am for hospital follow-up, medication management, and assessment for counseling services. Please bring photo ID, social security card, and proof of income (if any).    Contact information:   [address] [place], [place] [address] Phone: [phone] Fax: [phone]      Plan Of Care/Follow-up recommendations:  Activity:  as tolerated Diet:  regular Follow up as above  Geoffrey Luck A, MD 04/06/2015, 3:37 PM

## 2015-04-06 NOTE — [facility] Suicide Risk Assessment (Signed)
[facility] INPATIENT:  Family/Significant Other Suicide Prevention Education  Suicide Prevention Education:  Patient Refusal for Family/Significant Other Suicide Prevention Education: The patient Geoffrey [name] has refused to provide written consent for family/significant other to be provided Family/Significant Other Suicide Prevention Education during admission and/or prior to discharge.  Physician notified.  SPE completed with pt, as pt refused to consent to family contact. SPI pamphlet provided to pt and pt was encouraged to share information with support network, ask questions, and talk about any concerns relating to SPE. Pt denies access to guns/firearms and verbalized understanding of information provided. Mobile Crisis information also provided to pt.   Smart, Peyten Weare LCSW 04/06/2015, 11:26 AM

## 2015-04-06 NOTE — Tx Team (Addendum)
Interdisciplinary Treatment Plan Update (Adult)  Date:  04/06/2015  Time Reviewed:  11:05 AM   Progress in Treatment: Attending groups: Intermittently  Participating in groups:  Yes, when he attends  Taking medication as prescribed:  Yes. Tolerating medication:  Yes. Family/Significant othe contact made:  SPE completed with pt, as he refused to consent to family contact.  Patient understands diagnosis:  Yes. and As evidenced by:  seeking treatment for opiate/heroin/cocaine abuse, depression, and increased anxiety/medication stabilization. Discussing patient identified problems/goals with staff:  Yes. Medical problems stabilized or resolved:  Yes. Denies suicidal/homicidal ideation: Yes. Issues/concerns per patient self-inventory:  Other:  Discharge Plan or Barriers: Pt declined [facility] bed. He plans to follow-up at [facility] and will live with his grandmother at d/c. He plans to follow-up with [facility] "on my own."    Reason for Continuation of Hospitalization: none  Comments:  JERELLE VIRDEN is an 25 y.o. male. Pt presents voluntarily to [facility] for assessment.His mood is depressed and anxious, and his affect is mood congruent. He endorses SI. Pt says, "I have suicidal thoughts and they are freaking me out." He reports he got a new job two days ago and his depressvie sxs are preventing him from doing well at the job. He endorses hopelessness, anhedonia, fatigue, insomnia, tearfulness and worthlessness. He reports severe anxiety and says he had a panic attack last night. Pt reports his dad died from a drug overdose when dad was 29 yo. Pt reports that it has been exactly one year since his wife left him. He has two children who live with estranged wife. Pt denies HI. He denies [facility] and no delusions noted. Pt reports he has been abusing opiates (Oxycontin) and has recently begun using heroin again by snorting it. He says he has also started using cocaine. Pt says last use of heroin  and coke was 03/31/15. Pt says, "I want to get the right treatment." Pt says he wasn't completely honest about his depressive sxs last time he was admitted here July 2016 and he wants to be honest this time. He says he knows he needs help and says he is motivated to stay clean and sober. Pt reports he takes Adderall as prescribed but his friends sometime steal his meds. He says the Adderall works for his anxiety, but he says when he runs out of the med he then wants to use opiates. Pt sts he doesn't even like taking opiates, he is doing it now so he won't get sick. Pt denies any upcoming court dates. He says he has a traffic court date sometime in late March. Opioid Use Disorder, Severe Major Depressive Disorder, Recurrent, Moderate  Estimated length of stay:  D/c today   Additional Comments:  Patient and CSW reviewed pt's identified goals and treatment plan. Patient verbalized understanding and agreed to treatment plan. CSW reviewed [facility] "Discharge Process and Patient Involvement" Form. Pt verbalized understanding of information provided and signed form.    Review of initial/current patient goals per problem list:  1. Goal(s): Patient will participate in aftercare plan  Met: Yes  Target date: at discharge  As evidenced by: Patient will participate within aftercare plan AEB aftercare provider and housing plan at discharge being identified.  1/25: CSW assessing for appropriate referrals. [facility] referral faxed this morning per pt request.   1/27: Pt plans to return home with his grandmother and will follow-up at [facility].   2. Goal (s): Patient will exhibit decreased depressive symptoms and suicidal ideations.  Met: Yes.    Target date: at discharge  As evidenced by: Patient will utilize self rating of depression at 3 or below and demonstrate decreased signs of depression or be deemed stable for discharge by MD.  1/25: Pt rates depression as high this morning. He denies  SI/HI/AVH.   1/27: Pt rates depression as 0/10 and presents with pleasant mood/calm affect.   3. Goal(s): Patient will demonstrate decreased signs of withdrawal due to substance abuse  Met:Yes.   Target date:at discharge   As evidenced by: Patient will produce a CIWA/COWS score of 0, have stable vitals signs, and no symptoms of withdrawal.  1/25: Pt reports moderate withdrawals with COWS of 9 and low sitting BP.   1/27: Pt reports no signs of withdrawal with stable vitals.  Attendees: Patient:   04/06/2015 11:05 AM   Family:   04/06/2015 11:05 AM   Physician:  Dr. Carlton Adam, MD 04/06/2015 11:05 AM   Nursing:   Lennon Alstrom RN 04/06/2015 11:05 AM   Clinical Social Worker: Maxie Better, LCSW 04/06/2015 11:05 AM   Clinical Social Worker: Erasmo Downer Drinkard LCSWA; 04/06/2015 11:05 AM   Other:  Gerline Legacy Nurse Case Manager 04/06/2015 11:05 AM   Other:  Agustina Caroli NP  04/06/2015 11:05 AM   Other:   04/06/2015 11:05 AM   Other:  04/06/2015 11:05 AM   Other:  04/06/2015 11:05 AM   Other:  04/06/2015 11:05 AM    04/06/2015 11:05 AM    04/06/2015 11:05 AM    04/06/2015 11:05 AM    04/06/2015 11:05 AM    Scribe for Treatment Team:   Maxie Better, LCSW 04/06/2015 11:05 AM

## 2015-04-06 NOTE — Discharge Summary (Signed)
Physician Discharge Summary Note  Patient:  Geoffrey [name] is an 25 y.o., male MRN:  [id number] DOB:  Jun 15, 1990 Patient phone:  [phone] (home)  Patient address:   [place] [address],  Total Time spent with patient: 45 minutes  Date of Admission:  04/03/2015 Date of Discharge: 04/06/2015  Reason for Admission:   25 Y/O male who states that during the last 2 years he has been trough a lot. Has some friends died of OD father died at 89 of an OD. Separated from his wife a year ago, will be divorced next month. He had been staying here and there. He woke up to a woman he was staying with, dead of an OD. He states he has had to defend himself and has witnessed a lot of bad stuff. . States that he "always needs something in order to feel" when on Adderal he functions. Admits he is using mainly Oxy s 40-120 mg every single day. He was here in July, remained clean 2 months. Relapsed and has been using 4 months. States that depression plays a big role in his relapses. Would like to address his depression this time around  The initial assessment is as follows: Geoffrey [name] is an 25 y.o. male. Pt says, "I have suicidal thoughts and they are freaking me out." He reports he got a new job two days ago and his depressvie sxs are preventing him from doing well at the job. He endorses hopelessness, anhedonia, fatigue, insomnia, tearfulness and worthlessness. He reports severe anxiety and says he had a panic attack last night. Pt reports his dad died from a drug overdose when dad was 49 yo. Pt reports that it has been exactly one year since his wife left him. He has two children who live with estranged wife. Pt denies HI.Pt reports he has been abusing opiates (Oxycontin) and has recently begun using heroin again by snorting it. He says he has also started using cocaine. Pt says last use of heroin and coke was 03/31/15. Pt says, "I want to get the right treatment." Pt says he wasn't  completely honest about his depressive sxs last time he was admitted here July 2016 and he wants to be honest this time. He takes Adderall as prescribed but his friends sometime steal his meds. He says the Adderall works for his anxiety, but he says when he runs out of the med he then wants to use opiates.    Principal Problem: MDD (major depressive disorder), recurrent episode, severe [facility]) Discharge Diagnoses: Patient Active Problem List   Diagnosis Date Noted  . Substance induced mood disorder ([place]) [F19.94] 04/03/2015  . Opioid dependence with opioid-induced mood disorder ([place]) [F11.24]   . MDD (major depressive disorder), recurrent episode, severe ([place]) [F33.2] 09/12/2014    Musculoskeletal: Strength & Muscle Tone: within normal limits Gait & Station: normal Patient leans: N/A  Psychiatric Specialty Exam: Physical Exam  Psychiatric: He has a normal mood and affect. His speech is normal and behavior is normal. Judgment and thought content normal. Cognition and memory are normal.    Review of Systems  Constitutional: Negative.   HENT: Negative.   Eyes: Negative.   Respiratory: Negative.   Cardiovascular: Negative.   Gastrointestinal: Negative.   Genitourinary: Negative.   Musculoskeletal: Negative.   Skin: Negative.   Neurological: Negative.   Endo/Heme/Allergies: Negative.   Psychiatric/Behavioral: Positive for depression (Stabilized ). Negative for suicidal ideas, hallucinations, memory loss and substance abuse. The patient is  nervous/anxious and has insomnia.     Blood pressure 133/80, pulse 89, temperature 97.6 F (36.4 C), temperature source Oral, resp. rate 16, height 6' 4"  (1.93 m), weight 75.297 kg (166 lb).Body mass index is 20.21 kg/(m^2).  SEE MD PSE within the SRA   Have you used any form of tobacco in the last 30 days? (Cigarettes, Smokeless Tobacco, Cigars, and/or Pipes): Yes  Has this patient used any form of tobacco in the last 30 days? (Cigarettes,  Smokeless Tobacco, Cigars, and/or Pipes) Yes, A prescription for an FDA-approved tobacco cessation medication was offered at discharge and the patient refused  Past Medical History:  Past Medical History  Diagnosis Date  . Back pain, lumbosacral   . Anxiety     Past Surgical History  Procedure Laterality Date  . Arm surgery     Family History:  Family History  Problem Relation Age of Onset  . Multiple sclerosis Mother    Social History:  History  Alcohol Use No     History  Drug Use  . Yes  . Special: Heroin, "Crack" cocaine, Hydrocodone    Social History   Social History  . Marital Status: Legally Separated    Spouse Name: N/A  . Number of Children: N/A  . Years of Education: N/A   Social History Main Topics  . Smoking status: Current Every Day Smoker -- 1.00 packs/day for 7 years    Types: Cigarettes  . Smokeless tobacco: Never Used  . Alcohol Use: No  . Drug Use: Yes    Special: Heroin, "Crack" cocaine, Hydrocodone  . Sexual Activity: Yes   Other Topics Concern  . None   Social History Narrative   Risk to Self: Suicidal Ideation: Yes-Currently Present Suicidal Intent: No Is patient at risk for suicide?: Yes Suicidal Plan?: No Access to Means:  (n/a) What has been your use of drugs/alcohol within the last 12 months?: daily opiate use, recent cocaine use How many times?: 0 Other Self Harm Risks: none Triggers for Past Attempts:  (n/a) Intentional Self Injurious Behavior: None (pt sts hasn't cut himself in two years) Risk to Others: Homicidal Ideation: No Thoughts of Harm to Others: No Current Homicidal Intent: No Current Homicidal Plan: No Access to Homicidal Means: No Identified Victim: none History of harm to others?: No Assessment of Violence: None Noted Violent Behavior Description: pt denies violence - is calm Does patient have access to weapons?: No Criminal Charges Pending?: No Does patient have a court date: No Prior Inpatient Therapy:  Prior Inpatient Therapy: Yes Prior Therapy Dates: July 2016 Prior Therapy Facilty/Provider(s): [facility] Reason for Treatment: SI and substance abuse Prior Outpatient Therapy: Prior Outpatient Therapy: No Prior Therapy Dates: na Prior Therapy Facilty/Provider(s): na Reason for Treatment: na Does patient have an ACCT team?: No Does patient have Intensive In-House Services?  : No Does patient have [facility] services? : No Does patient have P4CC services?: No  Level of Care:  OP  Hospital Course:   Geoffrey [name] was admitted for MDD (major depressive disorder), recurrent episode, severe ([place]) , with psychosis and crisis management.  Pt was treated discharged with the medications listed below under Medication List.  Medical problems were identified and treated as needed.  Home medications were restarted as appropriate.  Improvement was monitored by observation and Di Kindle 's daily report of symptom reduction.  Emotional and mental status was monitored by daily self-inventory reports completed by Di Kindle and clinical staff.  Geoffrey [name] was evaluated by the treatment team for stability and plans for continued recovery upon discharge. Di Kindle 's motivation was an integral factor for scheduling further treatment. Employment, transportation, bed availability, health status, family support, and any pending legal issues were also considered during hospital stay. Pt was offered further treatment options upon discharge including but not limited to Residential, Intensive Outpatient, and Outpatient treatment.  Geoffrey [name] will follow up with the services as listed below under Follow Up Information.     Upon completion of this admission the patient was both mentally and medically stable for discharge denying suicidal/homicidal ideation, auditory/visual/tactile hallucinations, delusional thoughts and paranoia.    Geoffrey [name] responded well to treatment  with Adderall, Wellbutrin, Vistaril, Nicotine, and Trazodone without adverse effects.Pt demonstrated improvement without reported or observed adverse effects to the point of stability appropriate for outpatient management. Pertinent labs include: Glucose 156 Reviewed CBC, CMP, BAL, and UDS; all unremarkable aside from noted exceptions.     Discharge Vitals:   Blood pressure 133/80, pulse 89, temperature 97.6 F (36.4 C), temperature source Oral, resp. rate 16, height 6' 4"  (1.93 m), weight 75.297 kg (166 lb). Body mass index is 20.21 kg/(m^2). Lab Results:   Results for orders placed or performed during the hospital encounter of 04/03/15 (from the past 72 hour(s))  CBC     Status: Abnormal   Collection Time: 04/04/15  6:05 AM  Result Value Ref Range   WBC 6.4 4.0 - 10.5 K/uL   RBC 4.07 (L) 4.22 - 5.81 MIL/uL   Hemoglobin 13.1 13.0 - 17.0 g/dL   HCT 38.7 (L) 39.0 - 52.0 %   MCV 95.1 78.0 - 100.0 fL   MCH 32.2 26.0 - 34.0 pg   MCHC 33.9 30.0 - 36.0 g/dL   RDW 13.3 11.5 - 15.5 %   Platelets 155 150 - 400 K/uL    Comment: Performed at [facility]  Comprehensive metabolic panel     Status: Abnormal   Collection Time: 04/04/15  6:05 AM  Result Value Ref Range   Sodium 139 135 - 145 mmol/L   Potassium 3.9 3.5 - 5.1 mmol/L   Chloride 102 101 - 111 mmol/L   CO2 29 22 - 32 mmol/L   Glucose, Bld 156 (H) 65 - 99 mg/dL   BUN 20 6 - 20 mg/dL   Creatinine, Ser 0.99 0.61 - 1.24 mg/dL   Calcium 9.0 8.9 - 10.3 mg/dL   Total Protein 6.6 6.5 - 8.1 g/dL   Albumin 4.3 3.5 - 5.0 g/dL   AST 15 15 - 41 U/L   ALT 12 (L) 17 - 63 U/L   Alkaline Phosphatase 59 38 - 126 U/L   Total Bilirubin 0.4 0.3 - 1.2 mg/dL   GFR calc non Af Amer >60 >60 mL/min   GFR calc Af Amer >60 >60 mL/min    Comment: (NOTE) The eGFR has been calculated using the CKD EPI equation. This calculation has not been validated in all clinical situations. eGFR's persistently <60 mL/min signify possible Chronic  Kidney Disease.    Anion gap 8 5 - 15    Comment: Performed at [facility]  Hepatic function panel     Status: Abnormal   Collection Time: 04/04/15  6:05 AM  Result Value Ref Range   Total Protein 6.5 6.5 - 8.1 g/dL   Albumin 4.2 3.5 - 5.0 g/dL   AST 15 15 - 41 U/L  ALT 12 (L) 17 - 63 U/L   Alkaline Phosphatase 60 38 - 126 U/L   Total Bilirubin 0.4 0.3 - 1.2 mg/dL   Bilirubin, Direct <0.1 (L) 0.1 - 0.5 mg/dL   Indirect Bilirubin NOT CALCULATED 0.3 - 0.9 mg/dL    Comment: Performed at [facility]  TSH     Status: None   Collection Time: 04/04/15  6:05 AM  Result Value Ref Range   TSH 0.492 0.350 - 4.500 uIU/mL    Comment: Performed at [facility]  Urinalysis, Routine w reflex microscopic (not at [facility])     Status: Abnormal   Collection Time: 04/04/15  6:21 AM  Result Value Ref Range   Color, Urine AMBER (A) YELLOW    Comment: BIOCHEMICALS MAY BE AFFECTED BY COLOR   APPearance CLEAR CLEAR   Specific Gravity, Urine 1.039 (H) 1.005 - 1.030   pH 6.0 5.0 - 8.0   Glucose, UA NEGATIVE NEGATIVE mg/dL   Hgb urine dipstick NEGATIVE NEGATIVE   Bilirubin Urine NEGATIVE NEGATIVE   Ketones, ur NEGATIVE NEGATIVE mg/dL   Protein, ur NEGATIVE NEGATIVE mg/dL   Nitrite NEGATIVE NEGATIVE   Leukocytes, UA NEGATIVE NEGATIVE    Comment: MICROSCOPIC NOT DONE ON URINES WITH NEGATIVE PROTEIN, BLOOD, LEUKOCYTES, NITRITE, OR GLUCOSE <1000 mg/dL. Performed at [facility]   Urine rapid drug screen (hosp performed)not at [facility]     Status: None   Collection Time: 04/04/15  6:22 AM  Result Value Ref Range   Opiates NONE DETECTED NONE DETECTED   Cocaine NONE DETECTED NONE DETECTED   Benzodiazepines NONE DETECTED NONE DETECTED   Amphetamines NONE DETECTED NONE DETECTED   Tetrahydrocannabinol NONE DETECTED NONE DETECTED   Barbiturates NONE DETECTED NONE DETECTED    Comment:        DRUG SCREEN FOR MEDICAL PURPOSES ONLY.  IF  CONFIRMATION IS NEEDED FOR ANY PURPOSE, NOTIFY LAB WITHIN 5 DAYS.        LOWEST DETECTABLE LIMITS FOR URINE DRUG SCREEN Drug Class       Cutoff (ng/mL) Amphetamine      1000 Barbiturate      200 Benzodiazepine   177 Tricyclics       939 Opiates          300 Cocaine          300 THC              50 Performed at [facility]     Physical Findings: AIMS: Facial and Oral Movements Muscles of Facial Expression: None, normal Lips and Perioral Area: None, normal Jaw: None, normal Tongue: None, normal,Extremity Movements Upper (arms, wrists, hands, fingers): None, normal Lower (legs, knees, ankles, toes): None, normal, Trunk Movements Neck, shoulders, hips: None, normal, Overall Severity Severity of abnormal movements (highest score from questions above): None, normal Incapacitation due to abnormal movements: None, normal Patient's awareness of abnormal movements (rate only patient's report): No Awareness, Dental Status Current problems with teeth and/or dentures?: No Does patient usually wear dentures?: No  CIWA:  CIWA-Ar Total: 7 COWS:  COWS Total Score: 3   See Psychiatric Specialty Exam and Suicide Risk Assessment completed by Attending Physician prior to discharge.  Discharge destination:  Home  Is patient on multiple antipsychotic therapies at discharge:  No   Has Patient had three or more failed trials of antipsychotic monotherapy by history:  No  Recommended Plan for Multiple Antipsychotic Therapies: NA     Medication List  STOP taking these medications        cycloSPORINE 0.05 % ophthalmic emulsion  Commonly known as:  RESTASIS     ibuprofen 800 MG tablet  Commonly known as:  ADVIL,MOTRIN     multivitamin with minerals Tabs tablet     SYSTANE OP      TAKE these medications      Indication   amphetamine-dextroamphetamine 20 MG tablet  Commonly known as:  ADDERALL  Take 1 tablet (20 mg total) by mouth 2 (two) times daily with a  meal. Refills only at next appointment   Indication:  Attention Deficit Disorder     buPROPion 150 MG 24 hr tablet  Commonly known as:  WELLBUTRIN XL  Take 1 tablet (150 mg total) by mouth daily.   Indication:  Depressive Phase of Manic-Depression     hydrOXYzine 50 MG tablet  Commonly known as:  ATARAX/VISTARIL  Take 1 tablet (50 mg total) by mouth every 6 (six) hours as needed for anxiety.   Indication:  Anxiety Neurosis     nicotine 21 mg/24hr patch  Commonly known as:  NICODERM CQ - dosed in mg/24 hours  Place 1 patch (21 mg total) onto the skin daily.   Indication:  Nicotine Addiction     nicotine polacrilex 2 MG gum  Commonly known as:  NICORETTE  Take 1 each (2 mg total) by mouth as needed for smoking cessation.   Indication:  Nicotine Addiction     traZODone 150 MG tablet  Commonly known as:  DESYREL  Take 1 tablet (150 mg total) by mouth at bedtime.   Indication:  Trouble Sleeping       Follow-up Information    Follow up with Tamela Gammon On 04/09/2015.   Why:  Walk in between 8am-10am for hospital follow-up, medication management, and assessment for counseling services. Please bring photo ID, social security card, and proof of income (if any).    Contact information:   [address], [place] [address] Phone: [phone] Fax: [phone]      Follow-up recommendations:   Activity: as tolerated Diet: regular  Comments:   Take all your medications as prescribed by your mental healthcare provider.  Report any adverse effects and or reactions from your medicines to your outpatient provider promptly.  Patient is instructed and cautioned to not engage in alcohol and or illegal drug use while on prescription medicines.  In the event of worsening symptoms, patient is instructed to call the crisis hotline, 911 and or go to the nearest ED for appropriate evaluation and treatment of symptoms.  Follow-up with your primary care provider for your other medical  issues, concerns and or health care needs.   Total Discharge Time: Greater than 30 minutes  Signed: Benjamine Mola, FNP-BC 04/06/2015, 2:26 PM   I personally assessed the patient and formulated the plan Geralyn Flash A. Sabra Heck, M.D.

## 2015-04-06 NOTE — [facility] Group Notes (Signed)
[facility] LCSW Group Therapy  04/06/2015 11:38 AM  Type of Therapy:  Group Therapy  Participation Level:  Did Not Attend-pt invited. Chose to remain in bed.   Modes of Intervention:  Confrontation, Discussion, Education, Exploration, Problem-solving, Rapport Building, Socialization and Support  Summary of Progress/Problems: Feelings around Relapse. Group members discussed the meaning of relapse and shared personal stories of relapse, how it affected them and others, and how they perceived themselves during this time. Group members were encouraged to identify triggers, warning signs and coping skills used when facing the possibility of relapse. Social supports were discussed and explored in detail.  Smart, Evagelia Knack LCSW 04/06/2015, 11:38 AM

## 2015-04-06 NOTE — Progress Notes (Signed)
Patient ID: Geoffrey [name], male   DOB: 08-31-1990, 25 y.o.   MRN: [id number]  D: Patient reports feels better than on admission. Mood has improved with no SI. Reviewed all discharge paperwork A: Obtained all belongings, medications, prescriptions, and follow-up appointment R: Girlfriend here to pick patient up

## 2015-04-06 NOTE — [facility] Group Notes (Signed)
[facility] LCSW Aftercare Discharge Planning Group Note   04/06/2015 11:03 AM  Participation Quality:  Appropriate   Mood/Affect:  Appropriate  Depression Rating:  0  Anxiety Rating:  2  Thoughts of Suicide:  No Will you contract for safety?   NA  Current AVH:  No  Plan for Discharge/Comments:  Pt reports that he is no longer interested in [facility] and is thinking about [facility] in [place] "But they want me off the adderral." PT reports that he is planning to live with his grandmother and will follow-up at [facility] for the time being. He is hoping to d/c this weekend.   Transportation Means: grandmother   Supports: grandmother   Smart, Herbert Seta LCSW

## 2015-12-27 ENCOUNTER — Encounter (HOSPITAL_COMMUNITY): Payer: Self-pay | Admitting: Behavioral Health

## 2015-12-27 ENCOUNTER — Ambulatory Visit (HOSPITAL_COMMUNITY)
Admission: RE | Admit: 2015-12-27 | Discharge: 2015-12-27 | Disposition: A | Discharge status: Home / Self Care | Payer: No Typology Code available for payment source | Attending: Psychiatry | Admitting: Psychiatry

## 2015-12-27 DIAGNOSIS — F418 Other specified anxiety disorders: Secondary | ICD-10-CM | POA: Insufficient documentation

## 2015-12-27 DIAGNOSIS — R634 Abnormal weight loss: Secondary | ICD-10-CM | POA: Insufficient documentation

## 2015-12-27 DIAGNOSIS — R61 Generalized hyperhidrosis: Secondary | ICD-10-CM | POA: Insufficient documentation

## 2015-12-27 NOTE — H&P (Signed)
Behavioral Health Medical Screening Exam  Don BroachRobert M Marts is an 25 y.o. male.  Total Time spent with patient: 20 minutes  Psychiatric Specialty Exam: Physical Exam  Constitutional: He is oriented to person, place, and time. He appears well-developed and well-nourished.  HENT:  Head: Normocephalic and atraumatic.  Eyes: Pupils are equal, round, and reactive to light.  Neck: Normal range of motion.  Cardiovascular: Normal rate and normal heart sounds.   Respiratory: Effort normal and breath sounds normal.  GI: Soft. Bowel sounds are normal.  Musculoskeletal: Normal range of motion.  Neurological: He is alert and oriented to person, place, and time.  Skin: Skin is warm. He is diaphoretic.    Review of Systems  Constitutional: Positive for diaphoresis (mildly) and weight loss (self reports 20 lb wt loss in a month).  HENT: Negative.   Eyes: Negative.   Respiratory: Negative.   Cardiovascular: Negative.   Gastrointestinal: Negative.   Genitourinary: Negative.   Musculoskeletal: Negative.   Skin: Negative.   Neurological: Negative.   Endo/Heme/Allergies: Negative.   Psychiatric/Behavioral: Positive for depression, substance abuse and suicidal ideas (when having panic attacks). Negative for hallucinations and memory loss. The patient is nervous/anxious. The patient does not have insomnia.     BP 106/71   Pulse 70   Temp 98.2 F (36.8 C) (Oral)   Resp 16   SpO2 100%    General Appearance: Casual and Fairly Groomed  Eye Contact:  Fair  Speech:  Clear and Coherent and Normal Rate  Volume:  Normal  Mood:  Depressed  Affect:  Congruent and Depressed  Thought Process:  Coherent, Goal Directed and Linear  Orientation:  Full (Time, Place, and Person)  Thought Content:  Logical  Suicidal Thoughts:  Yes.  without intent/plan  Homicidal Thoughts:  No  Memory:  Immediate;   Good Recent;   Good Remote;   Fair  Judgement:  Fair  Insight:  Fair  Psychomotor Activity:  Normal   Concentration: Concentration: Good and Attention Span: Good  Recall:  Good  Fund of Knowledge:Good  Language: Good  Akathisia:  No  Handed:  Right  AIMS (if indicated):     Assets:  Communication Skills Desire for Improvement Resilience Social Support  Sleep:       Musculoskeletal: Strength & Muscle Tone: within normal limits Gait & Station: normal Patient leans: N/A  BP 106/71   Pulse 70   Temp 98.2 F (36.8 C) (Oral)   Resp 16   SpO2 100%   Recommendations:  Pt wanted to be inpatient for depression and anxiety. Revealed he used subutex this morning and has been using meth frequently. [facility]. Pt refused stating that he did not want to go for medical clearance, that he wanted to come straight into inpatient. Brandy, Therapist explained to pt that this was protocol and [facility] still refused [facility] would [facility] [place].   Laveda AbbeLaurie Britton Adon Gehlhausen, NP 12/27/2015, 4:31 PM

## 2015-12-27 NOTE — BH Assessment (Signed)
Tele Assessment Note   Don BroachRobert M Hovland is an 25 y.o. male. Pt denies current SI but states he has SI feelings when he has a panic attack. Pt denies HI and AVH. Pt reports increased and worsening depression and anxiety. Pt states that he has been experiencing depression and anxiety all of his life but "it is a bad place at this." Pt reports worthlessness, fatigue, irritability, and depressed mood most of the day. Pt reports decreased appetite and sleep. According to the PT, he has lost 20lbs in month. Pt reports opiate use. 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 Pt has been diagnosed with depression and ADHD. Pt is prescribed Adderall by his PCP. Pt states that his grandmother has a brain tumor and is verbally abusive.   Writer consulted with Jacki ConesLaurie, NP. Per Jacki ConesLaurie Pt meets inpatient criteria. Referred for medical clearance due to SA.   Update: [facility]   Diagnosis:  F33.2 MDD, severe, recurrent  Past Medical History:  Past Medical History:  Diagnosis Date  . Anxiety   . Back pain, lumbosacral     Past Surgical History:  Procedure Laterality Date  . arm surgery      Family History:  Family History  Problem Relation Age of Onset  . Multiple sclerosis Mother     Social History:  reports that he has been smoking Cigarettes.  He has a 7.00 pack-year smoking history. He has never used smokeless tobacco. He reports that he uses drugs, including Heroin, "Crack" cocaine, and Hydrocodone. He reports that he does not drink alcohol.  Additional Social History:  Alcohol / Drug Use Pain Medications: Pt denies Prescriptions: Adderall Over the Counter: Pt denies History of alcohol / drug use?: Yes Longest period of sobriety (when/how long): unknown Substance #1 Name of Substance 1: opiates 1 - Age of First Use: unknown 1 - Amount (size/oz): unknown 1 - Frequency: daily 1 - Duration: ongoing 1 - Last Use / Amount: 12/26/15  CIWA: CIWA-Ar BP:  106/71 Pulse Rate: 70 COWS:    PATIENT STRENGTHS: (choose at least two) Average or above average intelligence Communication skills  Allergies: No Known Allergies  Home Medications:  (Not in a hospital admission)  OB/GYN Status:  No LMP for male patient.  General Assessment Data [facility] Assessment: In system Is this a Tele or Face-to-Face Assessment?: Face-to-Face Is this an Initial Assessment or a Re-assessment for this encounter?: Initial Assessment Marital status: Single Maiden name: NA Is patient pregnant?: No Pregnancy Status: No Living Arrangements: Other relatives Can pt return to current living arrangement?: Yes Admission Status: Voluntary Is patient capable of signing voluntary admission?: Yes Referral Source: Self/Family/Friend Insurance type: Medicaid  Medical Screening Exam [facility]) Medical Exam completed: Yes (completed by Jacki ConesLaurie, NP)  Crisis Care Plan Living Arrangements: Other relatives Legal Guardian: Other: (self) Name of Psychiatrist: NA Name of Therapist: NA  Education Status Is patient currently in school?: No Current Grade: NA Highest grade of school patient has completed: 12 Name of school: NA Contact person: NA  Risk to self with the past 6 months Suicidal Ideation: No-Not Currently/Within Last 6 Months Has patient been a risk to self within the past 6 months prior to admission? : No Suicidal Intent: No Has patient had any suicidal intent within the past 6 months prior to admission? : No Is patient at risk for suicide?: No Suicidal Plan?: No Has patient had any suicidal plan within the past  6 months prior to admission? : No Access to Means: No What has been your use of drugs/alcohol within the last 12 months?: Opiates Previous Attempts/Gestures: Yes How many times?: 2 Other Self Harm Risks: NA Triggers for Past Attempts: None known Intentional Self Injurious Behavior: None Family  Suicide History: No Recent stressful life event(s): Conflict (Comment) (conflict with grandmother) Persecutory voices/beliefs?: No Depression: Yes Depression Symptoms: Tearfulness, Isolating, Fatigue, Guilt, Loss of interest in usual pleasures, Feeling worthless/self pity, Feeling angry/irritable Substance abuse history and/or treatment for substance abuse?: No Suicide prevention information given to non-admitted patients: Not applicable  Risk to Others within the past 6 months Homicidal Ideation: No Does patient have any lifetime risk of violence toward others beyond the six months prior to admission? : No Thoughts of Harm to Others: No-Not Currently Present/Within Last 6 Months Comment - Thoughts of Harm to Others: Pt states he thinks about hurting others sometimes (no specfic name) Current Homicidal Intent: No Current Homicidal Plan: No Access to Homicidal Means: No Identified Victim: NA History of harm to others?: No Assessment of Violence: None Noted Violent Behavior Description: NA Does patient have access to weapons?: No Criminal Charges Pending?: No Does patient have a court date: No Is patient on probation?: No  Psychosis Hallucinations: None noted Delusions: None noted  Mental Status Report Appearance/Hygiene: Unremarkable Eye Contact: Fair Motor Activity: Freedom of movement Speech: Logical/coherent Level of Consciousness: Alert Mood: Depressed Affect: Depressed Anxiety Level: Minimal Thought Processes: Relevant, Coherent Judgement: Unimpaired Orientation: Person, Place, Time, Situation Obsessive Compulsive Thoughts/Behaviors: None  Cognitive Functioning Concentration: Normal Memory: Recent Intact, Remote Intact IQ: Average Insight: Fair Impulse Control: Fair Appetite: Poor Weight Loss: 20 Weight Gain: 0 Sleep: Decreased Total Hours of Sleep: 6 Vegetative Symptoms: None  ADLScreening [facility] Assessment Services) Patient's cognitive ability adequate to  safely complete daily activities?: Yes Patient able to express need for assistance with ADLs?: Yes Independently performs ADLs?: Yes (appropriate for developmental age)  Prior Inpatient Therapy Prior Inpatient Therapy: Yes Prior Therapy Dates: 2017 Prior Therapy Facilty/Provider(s): [facility] Reason for Treatment: depression  Prior Outpatient Therapy Prior Outpatient Therapy: No Prior Therapy Dates: NA Prior Therapy Facilty/Provider(s): [facility] Reason for Treatment: NA Does patient have an ACCT team?: No Does patient have Intensive In-House Services?  : No Does patient have [facility] services? : No Does patient have P4CC services?: No  ADL Screening (condition at time of admission) Patient's cognitive ability adequate to safely complete daily activities?: Yes Is the patient deaf or have difficulty hearing?: No Does the patient have difficulty seeing, even when wearing glasses/contacts?: No Does the patient have difficulty concentrating, remembering, or making decisions?: No Patient able to express need for assistance with ADLs?: Yes Does the patient have difficulty dressing or bathing?: No Independently performs ADLs?: Yes (appropriate for developmental age) Does the patient have difficulty walking or climbing stairs?: No Weakness of Legs: None Weakness of Arms/Hands: None       Abuse/Neglect Assessment (Assessment to be complete while patient is alone) Physical Abuse: Denies Verbal Abuse: Yes, past (Comment) (per client) Sexual Abuse: Denies Exploitation of patient/patient's resources: Denies Self-Neglect: Denies Values / Beliefs Cultural Requests During Hospitalization: None Spiritual Requests During Hospitalization: None   Advance Directives (For Healthcare) Does patient have an advance directive?: No Would patient like information on creating an advanced directive?: No - patient declined information    Additional Information 1:1 In Past 12 Months?: No CIRT Risk:  No Elopement Risk: No Does patient have medical clearance?: No  Disposition:  Disposition Initial Assessment Completed for this Encounter: Yes Disposition of Patient: Inpatient treatment program (recommended inpatient but Pt left AMA ) Type of inpatient treatment program: Adult  Jarry Manon D 12/27/2015 4:56 PM
